# Patient Record
Sex: Female | Born: 1995 | Race: Black or African American | Hispanic: No | Marital: Single | Smoking: Former smoker
Health system: Southern US, Community
[De-identification: ages and names within clinical notes are randomized; demographics above are authoritative.]

---

## 2016-05-16 ENCOUNTER — Ambulatory Visit (INDEPENDENT_AMBULATORY_CARE_PROVIDER_SITE_OTHER): Payer: Medicaid Other | Admitting: Advanced Practice Midwife

## 2016-05-16 ENCOUNTER — Encounter: Payer: Self-pay | Admitting: Advanced Practice Midwife

## 2016-05-16 ENCOUNTER — Encounter: Payer: Self-pay | Admitting: *Deleted

## 2016-05-16 VITALS — BP 133/86 | HR 101 | Ht 67.5 in | Wt 149.0 lb

## 2016-05-16 DIAGNOSIS — A749 Chlamydial infection, unspecified: Secondary | ICD-10-CM | POA: Diagnosis not present

## 2016-05-16 DIAGNOSIS — Z3402 Encounter for supervision of normal first pregnancy, second trimester: Secondary | ICD-10-CM | POA: Diagnosis not present

## 2016-05-16 DIAGNOSIS — Z3689 Encounter for other specified antenatal screening: Secondary | ICD-10-CM

## 2016-05-16 DIAGNOSIS — O98812 Other maternal infectious and parasitic diseases complicating pregnancy, second trimester: Secondary | ICD-10-CM

## 2016-05-16 DIAGNOSIS — Z113 Encounter for screening for infections with a predominantly sexual mode of transmission: Secondary | ICD-10-CM | POA: Diagnosis not present

## 2016-05-16 DIAGNOSIS — Z3490 Encounter for supervision of normal pregnancy, unspecified, unspecified trimester: Secondary | ICD-10-CM | POA: Insufficient documentation

## 2016-05-16 DIAGNOSIS — O98312 Other infections with a predominantly sexual mode of transmission complicating pregnancy, second trimester: Secondary | ICD-10-CM

## 2016-05-16 DIAGNOSIS — Z3400 Encounter for supervision of normal first pregnancy, unspecified trimester: Secondary | ICD-10-CM

## 2016-05-16 DIAGNOSIS — Z23 Encounter for immunization: Secondary | ICD-10-CM | POA: Diagnosis not present

## 2016-05-16 DIAGNOSIS — O98819 Other maternal infectious and parasitic diseases complicating pregnancy, unspecified trimester: Secondary | ICD-10-CM

## 2016-05-16 DIAGNOSIS — Z363 Encounter for antenatal screening for malformations: Secondary | ICD-10-CM

## 2016-05-16 DIAGNOSIS — Z3A19 19 weeks gestation of pregnancy: Secondary | ICD-10-CM

## 2016-05-16 MED ORDER — CONCEPT OB 130-92.4-1 MG PO CAPS: 1.0000 | ORAL_CAPSULE | Freq: Every day | ORAL | 12 refills | Status: AC

## 2016-05-16 NOTE — Progress Notes (Signed)
Currently being treated for BV and took med last week for Chlamydia.  Seen @ [facility]  BEDSIDE u/s SHOWS IUP WITH  HC  MEASUREING [redacted]W[redacted]D FHTt IS 156 bpm

## 2016-05-16 NOTE — Patient Instructions (Signed)
Second Trimester of Pregnancy The second trimester is from week 13 through week 28, months 4 through 6. The second trimester is often a time when you feel your best. Your body has also adjusted to being pregnant, and you begin to feel better physically. Usually, morning sickness has lessened or quit completely, you may have more energy, and you may have an increase in appetite. The second trimester is also a time when the fetus is growing rapidly. At the end of the sixth month, the fetus is about 9 inches long and weighs about 1 pounds. You will likely begin to feel the baby move (quickening) between 18 and 20 weeks of the pregnancy. BODY CHANGES Your body goes through many changes during pregnancy. The changes vary from woman to woman.   Your weight will continue to increase. You will notice your lower abdomen bulging out.  You may begin to get stretch marks on your hips, abdomen, and breasts.  You may develop headaches that can be relieved by medicines approved by your health care provider.  You may urinate more often because the fetus is pressing on your bladder.  You may develop or continue to have heartburn as a result of your pregnancy.  You may develop constipation because certain hormones are causing the muscles that push waste through your intestines to slow down.  You may develop hemorrhoids or swollen, bulging veins (varicose veins).  You may have back pain because of the weight gain and pregnancy hormones relaxing your joints between the bones in your pelvis and as a result of a shift in weight and the muscles that support your balance.  Your breasts will continue to grow and be tender.  Your gums may bleed and may be sensitive to brushing and flossing.  Dark spots or blotches (chloasma, mask of pregnancy) may develop on your face. This will likely fade after the baby is born.  A dark line from your belly button to the pubic area (linea nigra) may appear. This will likely  fade after the baby is born.  You may have changes in your hair. These can include thickening of your hair, rapid growth, and changes in texture. Some women also have hair loss during or after pregnancy, or hair that feels dry or thin. Your hair will most likely return to normal after your baby is born. WHAT TO EXPECT AT YOUR PRENATAL VISITS During a routine prenatal visit:  You will be weighed to make sure you and the fetus are growing normally.  Your blood pressure will be taken.  Your abdomen will be measured to track your baby's growth.  The fetal heartbeat will be listened to.  Any test results from the previous visit will be discussed. Your health care provider may ask you:  How you are feeling.  If you are feeling the baby move.  If you have had any abnormal symptoms, such as leaking fluid, bleeding, severe headaches, or abdominal cramping.  If you are using any tobacco products, including cigarettes, chewing tobacco, and electronic cigarettes.  If you have any questions. Other tests that may be performed during your second trimester include:  Blood tests that check for:  Low iron levels (anemia).  Gestational diabetes (between 24 and 28 weeks).  Rh antibodies.  Urine tests to check for infections, diabetes, or protein in the urine.  An ultrasound to confirm the proper growth and development of the baby.  An amniocentesis to check for possible genetic problems.  Fetal screens for spina bifida   and Down syndrome.  HIV (human immunodeficiency virus) testing. Routine prenatal testing includes screening for HIV, unless you choose not to have this test. HOME CARE INSTRUCTIONS   Avoid all smoking, herbs, alcohol, and unprescribed drugs. These chemicals affect the formation and growth of the baby.  Do not use any tobacco products, including cigarettes, chewing tobacco, and electronic cigarettes. If you need help quitting, ask your health care provider. You may receive  counseling support and other resources to help you quit.  Follow your health care provider's instructions regarding medicine use. There are medicines that are either safe or unsafe to take during pregnancy.  Exercise only as directed by your health care provider. Experiencing uterine cramps is a good sign to stop exercising.  Continue to eat regular, healthy meals.  Wear a good support bra for breast tenderness.  Do not use hot tubs, steam rooms, or saunas.  Wear your seat belt at all times when driving.  Avoid raw meat, uncooked cheese, cat litter boxes, and soil used by cats. These carry germs that can cause birth defects in the baby.  Take your prenatal vitamins.  Take 1500-2000 mg of calcium daily starting at the 20th week of pregnancy until you deliver your baby.  Try taking a stool softener (if your health care provider approves) if you develop constipation. Eat more high-fiber foods, such as fresh vegetables or fruit and whole grains. Drink plenty of fluids to keep your urine clear or pale yellow.  Take warm sitz baths to soothe any pain or discomfort caused by hemorrhoids. Use hemorrhoid cream if your health care provider approves.  If you develop varicose veins, wear support hose. Elevate your feet for 15 minutes, 3-4 times a day. Limit salt in your diet.  Avoid heavy lifting, wear low heel shoes, and practice good posture.  Rest with your legs elevated if you have leg cramps or low back pain.  Visit your dentist if you have not gone yet during your pregnancy. Use a soft toothbrush to brush your teeth and be gentle when you floss.  A sexual relationship may be continued unless your health care provider directs you otherwise.  Continue to go to all your prenatal visits as directed by your health care provider. SEEK MEDICAL CARE IF:   You have dizziness.  You have mild pelvic cramps, pelvic pressure, or nagging pain in the abdominal area.  You have persistent nausea,  vomiting, or diarrhea.  You have a bad smelling vaginal discharge.  You have pain with urination. SEEK IMMEDIATE MEDICAL CARE IF:   You have a fever.  You are leaking fluid from your vagina.  You have spotting or bleeding from your vagina.  You have severe abdominal cramping or pain.  You have rapid weight gain or loss.  You have shortness of breath with chest pain.  You notice sudden or extreme swelling of your face, hands, ankles, feet, or legs.  You have not felt your baby move in over an hour.  You have severe headaches that do not go away with medicine.  You have vision changes.   This information is not intended to replace advice given to you by your health care provider. Make sure you discuss any questions you have with your health care provider.   Document Released: 06/14/2001 Document Revised: 07/11/2014 Document Reviewed: 08/21/2012 Elsevier Interactive Patient Education 2016 [employer]   Breastfeeding Deciding to breastfeed is one of the best choices you can make for you and your baby. A change   in hormones during pregnancy causes your breast tissue to grow and increases the number and size of your milk ducts. These hormones also allow proteins, sugars, and fats from your blood supply to make breast milk in your milk-producing glands. Hormones prevent breast milk from being released before your baby is born as well as prompt milk flow after birth. Once breastfeeding has begun, thoughts of your baby, as well as his or her sucking or crying, can stimulate the release of milk from your milk-producing glands.  BENEFITS OF BREASTFEEDING For Your Baby  Your first milk (colostrum) helps your baby's digestive system function better.  There are antibodies in your milk that help your baby fight off infections.  Your baby has a lower incidence of asthma, allergies, and sudden infant death syndrome.  The nutrients in breast milk are better for your baby than infant  formulas and are designed uniquely for your baby's needs.  Breast milk improves your baby's brain development.  Your baby is less likely to develop other conditions, such as childhood obesity, asthma, or type 2 diabetes mellitus. For You  Breastfeeding helps to create a very special bond between you and your baby.  Breastfeeding is convenient. Breast milk is always available at the correct temperature and costs nothing.  Breastfeeding helps to burn calories and helps you lose the weight gained during pregnancy.  Breastfeeding makes your uterus contract to its prepregnancy size faster and slows bleeding (lochia) after you give birth.   Breastfeeding helps to lower your risk of developing type 2 diabetes mellitus, osteoporosis, and breast or ovarian cancer later in life. SIGNS THAT YOUR BABY IS HUNGRY Early Signs of Hunger  Increased alertness or activity.  Stretching.  Movement of the head from side to side.  Movement of the head and opening of the mouth when the corner of the mouth or cheek is stroked (rooting).  Increased sucking sounds, smacking lips, cooing, sighing, or squeaking.  Hand-to-mouth movements.  Increased sucking of fingers or hands. Late Signs of Hunger  Fussing.  Intermittent crying. Extreme Signs of Hunger Signs of extreme hunger will require calming and consoling before your baby will be able to breastfeed successfully. Do not wait for the following signs of extreme hunger to occur before you initiate breastfeeding:  Restlessness.  A loud, strong cry.  Screaming. BREASTFEEDING BASICS Breastfeeding Initiation  Find a comfortable place to sit or lie down, with your neck and back well supported.  Place a pillow or rolled up blanket under your baby to bring him or her to the level of your breast (if you are seated). Nursing pillows are specially designed to help support your arms and your baby while you breastfeed.  Make sure that your baby's  abdomen is facing your abdomen.  Gently massage your breast. With your fingertips, massage from your chest wall toward your nipple in a circular motion. This encourages milk flow. You may need to continue this action during the feeding if your milk flows slowly.  Support your breast with 4 fingers underneath and your thumb above your nipple. Make sure your fingers are well away from your nipple and your baby's mouth.  Stroke your baby's lips gently with your finger or nipple.  When your baby's mouth is open wide enough, quickly bring your baby to your breast, placing your entire nipple and as much of the colored area around your nipple (areola) as possible into your baby's mouth.  More areola should be visible above your baby's upper lip than   below the lower lip.  Your baby's tongue should be between his or her lower gum and your breast.  Ensure that your baby's mouth is correctly positioned around your nipple (latched). Your baby's lips should create a seal on your breast and be turned out (everted).  It is common for your baby to suck about 2-3 minutes in order to start the flow of breast milk. Latching Teaching your baby how to latch on to your breast properly is very important. An improper latch can cause nipple pain and decreased milk supply for you and poor weight gain in your baby. Also, if your baby is not latched onto your nipple properly, he or she may swallow some air during feeding. This can make your baby fussy. Burping your baby when you switch breasts during the feeding can help to get rid of the air. However, teaching your baby to latch on properly is still the best way to prevent fussiness from swallowing air while breastfeeding. Signs that your baby has successfully latched on to your nipple:  Silent tugging or silent sucking, without causing you pain.  Swallowing heard between every 3-4 sucks.  Muscle movement above and in front of his or her ears while sucking. Signs  that your baby has not successfully latched on to nipple:  Sucking sounds or smacking sounds from your baby while breastfeeding.  Nipple pain. If you think your baby has not latched on correctly, slip your finger into the corner of your baby's mouth to break the suction and place it between your baby's gums. Attempt breastfeeding initiation again. Signs of Successful Breastfeeding Signs from your baby:  A gradual decrease in the number of sucks or complete cessation of sucking.  Falling asleep.  Relaxation of his or her body.  Retention of a small amount of milk in his or her mouth.  Letting go of your breast by himself or herself. Signs from you:  Breasts that have increased in firmness, weight, and size 1-3 hours after feeding.  Breasts that are softer immediately after breastfeeding.  Increased milk volume, as well as a change in milk consistency and color by the fifth day of breastfeeding.  Nipples that are not sore, cracked, or bleeding. Signs That Your Baby is Getting Enough Milk  Wetting at least 3 diapers in a 24-hour period. The urine should be clear and pale yellow by age 5 days.  At least 3 stools in a 24-hour period by age 5 days. The stool should be soft and yellow.  At least 3 stools in a 24-hour period by age 7 days. The stool should be seedy and yellow.  No loss of weight greater than 10% of birth weight during the first 3 days of age.  Average weight gain of 4-7 ounces (113-198 g) per week after age 4 days.  Consistent daily weight gain by age 5 days, without weight loss after the age of 2 weeks. After a feeding, your baby may spit up a small amount. This is common. BREASTFEEDING FREQUENCY AND DURATION Frequent feeding will help you make more milk and can prevent sore nipples and breast engorgement. Breastfeed when you feel the need to reduce the fullness of your breasts or when your baby shows signs of hunger. This is called "breastfeeding on demand." Avoid  introducing a pacifier to your baby while you are working to establish breastfeeding (the first 4-6 weeks after your baby is born). After this time you may choose to use a pacifier. Research has shown that   pacifier use during the first year of a baby's life decreases the risk of sudden infant death syndrome (SIDS). Allow your baby to feed on each breast as long as he or she wants. Breastfeed until your baby is finished feeding. When your baby unlatches or falls asleep while feeding from the first breast, offer the second breast. Because newborns are often sleepy in the first few weeks of life, you may need to awaken your baby to get him or her to feed. Breastfeeding times will vary from baby to baby. However, the following rules can serve as a guide to help you ensure that your baby is properly fed:  Newborns (babies 4 weeks of age or younger) may breastfeed every 1-3 hours.  Newborns should not go longer than 3 hours during the day or 5 hours during the night without breastfeeding.  You should breastfeed your baby a minimum of 8 times in a 24-hour period until you begin to introduce solid foods to your baby at around 6 months of age. BREAST MILK PUMPING Pumping and storing breast milk allows you to ensure that your baby is exclusively fed your breast milk, even at times when you are unable to breastfeed. This is especially important if you are going back to work while you are still breastfeeding or when you are not able to be present during feedings. Your lactation consultant can give you guidelines on how long it is safe to store breast milk. A breast pump is a machine that allows you to pump milk from your breast into a sterile bottle. The pumped breast milk can then be stored in a refrigerator or freezer. Some breast pumps are operated by hand, while others use electricity. Ask your lactation consultant which type will work best for you. Breast pumps can be purchased, but some hospitals and  breastfeeding support groups lease breast pumps on a monthly basis. A lactation consultant can teach you how to hand express breast milk, if you prefer not to use a pump. CARING FOR YOUR BREASTS WHILE YOU BREASTFEED Nipples can become dry, cracked, and sore while breastfeeding. The following recommendations can help keep your breasts moisturized and healthy:  Avoid using soap on your nipples.  Wear a supportive bra. Although not required, special nursing bras and tank tops are designed to allow access to your breasts for breastfeeding without taking off your entire bra or top. Avoid wearing underwire-style bras or extremely tight bras.  Air dry your nipples for 3-4minutes after each feeding.  Use only cotton bra pads to absorb leaked breast milk. Leaking of breast milk between feedings is normal.  Use lanolin on your nipples after breastfeeding. Lanolin helps to maintain your skin's normal moisture barrier. If you use pure lanolin, you do not need to wash it off before feeding your baby again. Pure lanolin is not toxic to your baby. You may also hand express a few drops of breast milk and gently massage that milk into your nipples and allow the milk to air dry. In the first few weeks after giving birth, some women experience extremely full breasts (engorgement). Engorgement can make your breasts feel heavy, warm, and tender to the touch. Engorgement peaks within 3-5 days after you give birth. The following recommendations can help ease engorgement:  Completely empty your breasts while breastfeeding or pumping. You may want to start by applying warm, moist heat (in the shower or with warm water-soaked hand towels) just before feeding or pumping. This increases circulation and helps the milk   flow. If your baby does not completely empty your breasts while breastfeeding, pump any extra milk after he or she is finished.  Wear a snug bra (nursing or regular) or tank top for 1-2 days to signal your body  to slightly decrease milk production.  Apply ice packs to your breasts, unless this is too uncomfortable for you.  Make sure that your baby is latched on and positioned properly while breastfeeding. If engorgement persists after 48 hours of following these recommendations, contact your health care provider or a lactation consultant. OVERALL HEALTH CARE RECOMMENDATIONS WHILE BREASTFEEDING  Eat healthy foods. Alternate between meals and snacks, eating 3 of each per day. Because what you eat affects your breast milk, some of the foods may make your baby more irritable than usual. Avoid eating these foods if you are sure that they are negatively affecting your baby.  Drink milk, fruit juice, and water to satisfy your thirst (about 10 glasses a day).  Rest often, relax, and continue to take your prenatal vitamins to prevent fatigue, stress, and anemia.  Continue breast self-awareness checks.  Avoid chewing and smoking tobacco. Chemicals from cigarettes that pass into breast milk and exposure to secondhand smoke may harm your baby.  Avoid alcohol and drug use, including marijuana. Some medicines that may be harmful to your baby can pass through breast milk. It is important to ask your health care provider before taking any medicine, including all over-the-counter and prescription medicine as well as vitamin and herbal supplements. It is possible to become pregnant while breastfeeding. If birth control is desired, ask your health care provider about options that will be safe for your baby. SEEK MEDICAL CARE IF:  You feel like you want to stop breastfeeding or have become frustrated with breastfeeding.  You have painful breasts or nipples.  Your nipples are cracked or bleeding.  Your breasts are red, tender, or warm.  You have a swollen area on either breast.  You have a fever or chills.  You have nausea or vomiting.  You have drainage other than breast milk from your nipples.  Your  breasts do not become full before feedings by the fifth day after you give birth.  You feel sad and depressed.  Your baby is too sleepy to eat well.  Your baby is having trouble sleeping.   Your baby is wetting less than 3 diapers in a 24-hour period.  Your baby has less than 3 stools in a 24-hour period.  Your baby's skin or the white part of his or her eyes becomes yellow.   Your baby is not gaining weight by 5 days of age. SEEK IMMEDIATE MEDICAL CARE IF:  Your baby is overly tired (lethargic) and does not want to wake up and feed.  Your baby develops an unexplained fever.   This information is not intended to replace advice given to you by your health care provider. Make sure you discuss any questions you have with your health care provider.   Document Released: 06/20/2005 Document Revised: 03/11/2015 Document Reviewed: 12/12/2012 Elsevier Interactive Patient Education 2016 [employer] Inc.  

## 2016-05-16 NOTE — Progress Notes (Signed)
   Subjective:    Patricia Clarke is a G1P0 7863w4d by LMP/US today being seen today for her first obstetrical visit.  Her obstetrical history is significant for nulliparity. Patient does intend to breast feed. Pregnancy history fully reviewed.  [facility] Dx Chlamydia, BV ~4 days ago. Tx'd w/ Azithro, Rocephin, on 4th day of Flagyl. partner not yet Tx'd, but planning to. No IC since Tx.   Patient reports no complaints.  Vitals:   05/16/16 1105 05/16/16 1108  BP: 133/86   Pulse: (!) 101   Weight: 149 lb (67.6 kg)   Height:  5' 7.5" (1.715 m)    HISTORY: OB History  Gravida Para Term Preterm AB Living  1            SAB TAB Ectopic Multiple Live Births               # Outcome Date GA Lbr Len/2nd Weight Sex Delivery Anes PTL Lv  1 Current              History reviewed. No pertinent past medical history. History reviewed. No pertinent surgical history. Family History  Problem Relation Age of Onset  . Hypertension Maternal Grandmother   . Kidney disease Maternal Grandmother      Exam    Uterus:   16 week size  Pelvic Exam: Deferred due to recent exam.   Bony Pelvis: unproven  System: Breast:  normal appearance, no masses or tenderness   Skin: normal coloration and turgor, no rashes    Neurologic: oriented, grossly non-focal   Extremities: normal strength, tone, and muscle mass   HEENT sclera clear, anicteric, oropharynx clear, no lesions and thyroid without masses   Mouth/Teeth mucous membranes moist, pharynx normal without lesions and dental hygiene good   Neck supple and no masses   Cardiovascular: regular rate and rhythm, no murmurs or gallops   Respiratory:  appears well, vitals normal, no respiratory distress, acyanotic, normal RR, neck free of mass or lymphadenopathy   Abdomen: soft, non-tender; bowel sounds normal; no masses,  no organomegaly   Urinary: not examined      Assessment:    Pregnancy: G1P0 Patient Active Problem List   Diagnosis Date  Noted  . Supervision of normal pregnancy, antepartum 05/16/2016     1. Supervision of normal first pregnancy, antepartum  - Prenatal Profile - HgB A1c - Culture, OB Urine - GC/Chlamydia probe amp ([place])not at [facility] - Pain Mgmt, Profile 6 Conf w/o mM, U - Glucose - US bedside; Future - Cystic fibrosis diagnostic study - Sickle Cell Scr - AFP, Quad Screen - US MFM OB DETAIL +14 WK; Future  2. Encounter for antenatal screening for malformation using ultrasound  - US MFM OB DETAIL +14 WK; Future  3. [redacted] weeks gestation of pregnancy  - US MFM OB DETAIL +14 WK; Future  Plan:     Initial labs drawn. Prenatal vitamins. Problem list reviewed and updated. Genetic Screening discussed Quad Screen: ordered.  Ultrasound discussed; fetal survey: ordered.  Follow up in 4 weeks. Discussed clinic routines, schedule of care and testing, genetic screening options, involvement of students and residents under the direct supervision of APPs and doctors and presence of female providers. Pt verbalized understanding.   Patricia Clarke 05/16/2016

## 2016-05-17 LAB — PRENATAL PROFILE (SOLSTAS)
Antibody Screen: NEGATIVE
BASOS ABS: 0 {cells}/uL (ref 0–200)
BASOS PCT: 0 %
EOS ABS: 105 {cells}/uL (ref 15–500)
Eosinophils Relative: 1 %
HCT: 37.7 % (ref 35.0–45.0)
HEP B S AG: NEGATIVE
HIV: NONREACTIVE
Hemoglobin: 12.9 g/dL (ref 11.7–15.5)
Lymphocytes Relative: 18 %
Lymphs Abs: 1890 cells/uL (ref 850–3900)
MCH: 28 pg (ref 27.0–33.0)
MCHC: 34.2 g/dL (ref 32.0–36.0)
MCV: 81.8 fL (ref 80.0–100.0)
MONO ABS: 735 {cells}/uL (ref 200–950)
MPV: 9.7 fL (ref 7.5–12.5)
Monocytes Relative: 7 %
NEUTROS ABS: 7770 {cells}/uL (ref 1500–7800)
Neutrophils Relative %: 74 %
PLATELETS: 307 10*3/uL (ref 140–400)
RBC: 4.61 MIL/uL (ref 3.80–5.10)
RDW: 15.2 % — AB (ref 11.0–15.0)
RUBELLA: 1.23 {index} — AB (ref <0.90)
Rh Type: POSITIVE
WBC: 10.5 10*3/uL (ref 3.8–10.8)

## 2016-05-17 LAB — AFP, QUAD SCREEN
AFP: 48.2 ng/mL
Age Alone: 1:1180 {titer}
CURR GEST AGE: 15.6 wk
Down Syndrome Scr Risk Est: 1:29600 {titer}
HCG, Total: 69.5 IU/mL
INH: 120.7 pg/mL
Interpretation-AFP: NEGATIVE
MOM FOR INH: 0.67
MoM for AFP: 1.58
MoM for hCG: 1.59
Open Spina bifida: NEGATIVE
Tri 18 Scr Risk Est: NEGATIVE
UE3 MOM: 0.96
uE3 Value: 0.65 ng/mL

## 2016-05-17 LAB — HEMOGLOBIN A1C
HEMOGLOBIN A1C: 4.9 % (ref <5.7)
Mean Plasma Glucose: 94 mg/dL

## 2016-05-17 LAB — CULTURE, OB URINE: Organism ID, Bacteria: NO GROWTH

## 2016-05-17 LAB — SICKLE CELL SCREEN: Sickle Cell Screen: NEGATIVE

## 2016-05-17 LAB — GLUCOSE, RANDOM: GLUCOSE: 70 mg/dL (ref 65–99)

## 2016-05-20 LAB — PAIN MGMT, PROFILE 6 CONF W/O MM, U
6 ACETYLMORPHINE: NEGATIVE ng/mL (ref <10)
ALCOHOL METABOLITES: NEGATIVE ng/mL (ref <500)
Amphetamines: NEGATIVE ng/mL (ref <500)
BENZODIAZEPINES: NEGATIVE ng/mL (ref <100)
Barbiturates: NEGATIVE ng/mL (ref <300)
Cocaine Metabolite: NEGATIVE ng/mL (ref <150)
Creatinine: 281.9 mg/dL (ref >=20.0)
ETHYL GLUCURONIDE (ETG): NEGATIVE ng/mL (ref <500)
ETHYL SULFATE (ETS): NEGATIVE ng/mL (ref <100)
MARIJUANA METABOLITE: NEGATIVE ng/mL (ref <20)
METHADONE METABOLITE: NEGATIVE ng/mL (ref <100)
OPIATES: NEGATIVE ng/mL (ref <100)
Oxidant: NEGATIVE ug/mL (ref <200)
Oxycodone: NEGATIVE ng/mL (ref <100)
Phencyclidine: NEGATIVE ng/mL (ref <25)
Please note:: 0
pH: 6.25 (ref 4.5–9.0)

## 2016-05-23 LAB — CYSTIC FIBROSIS DIAGNOSTIC STUDY

## 2016-05-31 ENCOUNTER — Encounter (HOSPITAL_COMMUNITY): Payer: Self-pay | Admitting: Advanced Practice Midwife

## 2016-06-08 ENCOUNTER — Ambulatory Visit (HOSPITAL_COMMUNITY): Payer: Self-pay

## 2016-06-08 ENCOUNTER — Ambulatory Visit (HOSPITAL_COMMUNITY)
Admission: RE | Admit: 2016-06-08 | Discharge: 2016-06-08 | Disposition: A | Discharge status: Home / Self Care | Payer: Medicaid Other | Source: Ambulatory Visit | Attending: Advanced Practice Midwife | Admitting: Advanced Practice Midwife

## 2016-06-08 DIAGNOSIS — Z3A19 19 weeks gestation of pregnancy: Secondary | ICD-10-CM

## 2016-06-08 DIAGNOSIS — Z3A18 18 weeks gestation of pregnancy: Secondary | ICD-10-CM | POA: Insufficient documentation

## 2016-06-08 DIAGNOSIS — Z3400 Encounter for supervision of normal first pregnancy, unspecified trimester: Secondary | ICD-10-CM

## 2016-06-08 DIAGNOSIS — Z363 Encounter for antenatal screening for malformations: Secondary | ICD-10-CM | POA: Insufficient documentation

## 2016-06-13 ENCOUNTER — Encounter: Payer: Self-pay | Admitting: Certified Nurse Midwife

## 2016-06-17 ENCOUNTER — Ambulatory Visit (INDEPENDENT_AMBULATORY_CARE_PROVIDER_SITE_OTHER): Payer: Medicaid Other | Admitting: Advanced Practice Midwife

## 2016-06-17 VITALS — BP 124/85 | HR 95 | Wt 154.0 lb

## 2016-06-17 DIAGNOSIS — IMO0002 Reserved for concepts with insufficient information to code with codable children: Secondary | ICD-10-CM

## 2016-06-17 DIAGNOSIS — Z0489 Encounter for examination and observation for other specified reasons: Secondary | ICD-10-CM

## 2016-06-17 DIAGNOSIS — O98812 Other maternal infectious and parasitic diseases complicating pregnancy, second trimester: Secondary | ICD-10-CM

## 2016-06-17 DIAGNOSIS — Z3A20 20 weeks gestation of pregnancy: Secondary | ICD-10-CM

## 2016-06-17 DIAGNOSIS — B3731 Acute candidiasis of vulva and vagina: Secondary | ICD-10-CM

## 2016-06-17 DIAGNOSIS — Z3400 Encounter for supervision of normal first pregnancy, unspecified trimester: Secondary | ICD-10-CM

## 2016-06-17 DIAGNOSIS — B373 Candidiasis of vulva and vagina: Secondary | ICD-10-CM

## 2016-06-17 DIAGNOSIS — L253 Unspecified contact dermatitis due to other chemical products: Secondary | ICD-10-CM

## 2016-06-17 MED ORDER — TERCONAZOLE 0.4 % VA CREA: 1.0000 | TOPICAL_CREAM | Freq: Every day | VAGINAL | 0 refills | Status: AC

## 2016-06-17 MED ORDER — NYSTATIN-TRIAMCINOLONE 100000-0.1 UNIT/GM-% EX OINT: 1.0000 "application " | TOPICAL_OINTMENT | Freq: Two times a day (BID) | CUTANEOUS | 0 refills | Status: AC

## 2016-06-17 NOTE — Patient Instructions (Signed)
Second Trimester of Pregnancy The second trimester is from week 13 through week 28 (months 4 through 6). The second trimester is often a time when you feel your best. Your body has also adjusted to being pregnant, and you begin to feel better physically. Usually, morning sickness has lessened or quit completely, you may have more energy, and you may have an increase in appetite. The second trimester is also a time when the fetus is growing rapidly. At the end of the sixth month, the fetus is about 9 inches long and weighs about 1 pounds. You will likely begin to feel the baby move (quickening) between 18 and 20 weeks of the pregnancy. Body changes during your second trimester Your body continues to go through many changes during your second trimester. The changes vary from woman to woman.  Your weight will continue to increase. You will notice your lower abdomen bulging out.  You may begin to get stretch marks on your hips, abdomen, and breasts.  You may develop headaches that can be relieved by medicines. The medicines should be approved by your health care provider.  You may urinate more often because the fetus is pressing on your bladder.  You may develop or continue to have heartburn as a result of your pregnancy.  You may develop constipation because certain hormones are causing the muscles that push waste through your intestines to slow down.  You may develop hemorrhoids or swollen, bulging veins (varicose veins).  You may have back pain. This is caused by:  Weight gain.  Pregnancy hormones that are relaxing the joints in your pelvis.  A shift in weight and the muscles that support your balance.  Your breasts will continue to grow and they will continue to become tender.  Your gums may bleed and may be sensitive to brushing and flossing.  Dark spots or blotches (chloasma, mask of pregnancy) may develop on your face. This will likely fade after the baby is born.  A dark line  from your belly button to the pubic area (linea nigra) may appear. This will likely fade after the baby is born.  You may have changes in your hair. These can include thickening of your hair, rapid growth, and changes in texture. Some women also have hair loss during or after pregnancy, or hair that feels dry or thin. Your hair will most likely return to normal after your baby is born. What to expect at prenatal visits During a routine prenatal visit:  You will be weighed to make sure you and the fetus are growing normally.  Your blood pressure will be taken.  Your abdomen will be measured to track your baby's growth.  The fetal heartbeat will be listened to.  Any test results from the previous visit will be discussed. Your health care provider may ask you:  How you are feeling.  If you are feeling the baby move.  If you have had any abnormal symptoms, such as leaking fluid, bleeding, severe headaches, or abdominal cramping.  If you are using any tobacco products, including cigarettes, chewing tobacco, and electronic cigarettes.  If you have any questions. Other tests that may be performed during your second trimester include:  Blood tests that check for:  Low iron levels (anemia).  Gestational diabetes (between 24 and 28 weeks).  Rh antibodies. This is to check for a protein on red blood cells (Rh factor).  Urine tests to check for infections, diabetes, or protein in the urine.  An ultrasound to   confirm the proper growth and development of the baby.  An amniocentesis to check for possible genetic problems.  Fetal screens for spina bifida and Down syndrome.  HIV (human immunodeficiency virus) testing. Routine prenatal testing includes screening for HIV, unless you choose not to have this test. Follow these instructions at home: Eating and drinking  Continue to eat regular, healthy meals.  Avoid raw meat, uncooked cheese, cat litter boxes, and soil used by cats. These  carry germs that can cause birth defects in the baby.  Take your prenatal vitamins.  Take 1500-2000 mg of calcium daily starting at the 20th week of pregnancy until you deliver your baby.  If you develop constipation:  Take over-the-counter or prescription medicines.  Drink enough fluid to keep your urine clear or pale yellow.  Eat foods that are high in fiber, such as fresh fruits and vegetables, whole grains, and beans.  Limit foods that are high in fat and processed sugars, such as fried and sweet foods. Activity  Exercise only as directed by your health care provider. Experiencing uterine cramps is a good sign to stop exercising.  Avoid heavy lifting, wear low heel shoes, and practice good posture.  Wear your seat belt at all times when driving.  Rest with your legs elevated if you have leg cramps or low back pain.  Wear a good support bra for breast tenderness.  Do not use hot tubs, steam rooms, or saunas. Lifestyle  Avoid all smoking, herbs, alcohol, and unprescribed drugs. These chemicals affect the formation and growth of the baby.  Do not use any products that contain nicotine or tobacco, such as cigarettes and e-cigarettes. If you need help quitting, ask your health care provider.  A sexual relationship may be continued unless your health care provider directs you otherwise. General instructions  Follow your health care provider's instructions regarding medicine use. There are medicines that are either safe or unsafe to take during pregnancy.  Take warm sitz baths to soothe any pain or discomfort caused by hemorrhoids. Use hemorrhoid cream if your health care provider approves.  If you develop varicose veins, wear support hose. Elevate your feet for 15 minutes, 3-4 times a day. Limit salt in your diet.  Visit your dentist if you have not gone yet during your pregnancy. Use a soft toothbrush to brush your teeth and be gentle when you floss.  Keep all follow-up  prenatal visits as told by your health care provider. This is important. Contact a health care provider if:  You have dizziness.  You have mild pelvic cramps, pelvic pressure, or nagging pain in the abdominal area.  You have persistent nausea, vomiting, or diarrhea.  You have a bad smelling vaginal discharge.  You have pain with urination. Get help right away if:  You have a fever.  You are leaking fluid from your vagina.  You have spotting or bleeding from your vagina.  You have severe abdominal cramping or pain.  You have rapid weight gain or weight loss.  You have shortness of breath with chest pain.  You notice sudden or extreme swelling of your face, hands, ankles, feet, or legs.  You have not felt your baby move in over an hour.  You have severe headaches that do not go away with medicine.  You have vision changes. Summary  The second trimester is from week 13 through week 28 (months 4 through 6). It is also a time when the fetus is growing rapidly.  Your body goes   through many changes during pregnancy. The changes vary from woman to woman.  Avoid all smoking, herbs, alcohol, and unprescribed drugs. These chemicals affect the formation and growth your baby.  Do not use any tobacco products, such as cigarettes, chewing tobacco, and e-cigarettes. If you need help quitting, ask your health care provider.  Contact your health care provider if you have any questions. Keep all prenatal visits as told by your health care provider. This is important. This information is not intended to replace advice given to you by your health care provider. Make sure you discuss any questions you have with your health care provider. Document Released: 06/14/2001 Document Revised: 11/26/2015 Document Reviewed: 08/21/2012 [employer] Interactive Patient Education  2017 [employer]  

## 2016-06-17 NOTE — Progress Notes (Signed)
   PRENATAL VISIT NOTE  Subjective:  Patricia Clarke is a 20 y.o. G1P0 at 7574w1d being seen today for ongoing prenatal care.  She is currently monitored for the following issues for this low-risk pregnancy and has Supervision of normal pregnancy, antepartum and Chlamydia infection affecting pregnancy on her problem list.  Patient reports reaction to latex condoms with itching, lasting 2 weeks.  Contractions: Not present. Vag. Bleeding: None.  Movement: Present. Denies leaking of fluid.   The following portions of the patient's history were reviewed and updated as appropriate: allergies, current medications, past family history, past medical history, past social history, past surgical history and problem list. Problem list updated.  Objective:   Vitals:   06/17/16 1045  BP: 124/85  Pulse: 95  Weight: 154 lb (69.9 kg)    Fetal Status: Fetal Heart Rate (bpm): 153   Movement: Present     General:  Alert, oriented and cooperative. Patient is in no acute distress.  Skin: Skin is warm and dry. No rash noted.   Cardiovascular: Normal heart rate noted  Respiratory: Normal respiratory effort, no problems with respiration noted  Abdomen: Soft, gravid, appropriate for gestational age. Pain/Pressure: Absent     Pelvic:  Cervical exam deferred      Visual inspection reveals mild erythema of bilateral labia with moderate amount thick white discharge c/w yeast  Extremities: Normal range of motion.  Edema: None  Mental Status: Normal mood and affect. Normal behavior. Normal judgment and thought content.   Assessment and Plan:  Pregnancy: G1P0 at 7274w1d  1. [redacted] weeks gestation of pregnancy  - Culture, OB Urine - US MFM OB FOLLOW UP; Future  2. Evaluate anatomy not seen on prior sonogram  - US MFM OB FOLLOW UP; Future  3. Vaginal candidiasis --Likely contact dermatitis from condoms caused irritation, leading to candidiasis.  - terconazole (TERAZOL 7) 0.4 % vaginal cream; Place 1  applicator vaginally at bedtime.  Dispense: 45 g; Refill: 0 - Wet prep, genital  4. Allergic contact dermatitis due to latex  - nystatin-triamcinolone ointment (MYCOLOG); Apply 1 application topically 2 (two) times daily.  Dispense: 30 g; Refill: 0   Preterm labor symptoms and general obstetric precautions including but not limited to vaginal bleeding, contractions, leaking of fluid and fetal movement were reviewed in detail with the patient. Please refer to After Visit Summary for other counseling recommendations.  Return in about 4 weeks (around 07/15/2016).   Hurshel PartyLisa A Leftwich-Kirby, CNM

## 2016-06-18 LAB — WET PREP, GENITAL
Trich, Wet Prep: NONE SEEN
Yeast Wet Prep HPF POC: NONE SEEN

## 2016-06-18 LAB — CULTURE, OB URINE

## 2016-07-15 ENCOUNTER — Encounter: Payer: Medicaid Other | Admitting: Advanced Practice Midwife

## 2016-08-12 ENCOUNTER — Other Ambulatory Visit (HOSPITAL_COMMUNITY)
Admission: RE | Admit: 2016-08-12 | Discharge: 2016-08-12 | Disposition: A | Discharge status: Home / Self Care | Payer: Medicaid Other | Source: Ambulatory Visit | Attending: Advanced Practice Midwife | Admitting: Advanced Practice Midwife

## 2016-08-12 ENCOUNTER — Ambulatory Visit (INDEPENDENT_AMBULATORY_CARE_PROVIDER_SITE_OTHER): Payer: Medicaid Other | Admitting: Advanced Practice Midwife

## 2016-08-12 VITALS — BP 121/82 | HR 87 | Wt 174.0 lb

## 2016-08-12 DIAGNOSIS — Z113 Encounter for screening for infections with a predominantly sexual mode of transmission: Secondary | ICD-10-CM | POA: Diagnosis present

## 2016-08-12 DIAGNOSIS — O0933 Supervision of pregnancy with insufficient antenatal care, third trimester: Secondary | ICD-10-CM | POA: Insufficient documentation

## 2016-08-12 DIAGNOSIS — A749 Chlamydial infection, unspecified: Secondary | ICD-10-CM

## 2016-08-12 DIAGNOSIS — Z0489 Encounter for examination and observation for other specified reasons: Secondary | ICD-10-CM

## 2016-08-12 DIAGNOSIS — Z3492 Encounter for supervision of normal pregnancy, unspecified, second trimester: Secondary | ICD-10-CM

## 2016-08-12 DIAGNOSIS — Z3400 Encounter for supervision of normal first pregnancy, unspecified trimester: Secondary | ICD-10-CM

## 2016-08-12 DIAGNOSIS — O98819 Other maternal infectious and parasitic diseases complicating pregnancy, unspecified trimester: Secondary | ICD-10-CM

## 2016-08-12 DIAGNOSIS — IMO0002 Reserved for concepts with insufficient information to code with codable children: Secondary | ICD-10-CM

## 2016-08-12 DIAGNOSIS — Z3A28 28 weeks gestation of pregnancy: Secondary | ICD-10-CM

## 2016-08-12 DIAGNOSIS — O98313 Other infections with a predominantly sexual mode of transmission complicating pregnancy, third trimester: Secondary | ICD-10-CM

## 2016-08-12 LAB — CBC
HEMATOCRIT: 37.4 % (ref 35.0–45.0)
Hemoglobin: 12.8 g/dL (ref 11.7–15.5)
MCH: 27.9 pg (ref 27.0–33.0)
MCHC: 34.2 g/dL (ref 32.0–36.0)
MCV: 81.5 fL (ref 80.0–100.0)
MPV: 10.4 fL (ref 7.5–12.5)
Platelets: 289 10*3/uL (ref 140–400)
RBC: 4.59 MIL/uL (ref 3.80–5.10)
RDW: 14.3 % (ref 11.0–15.0)
WBC: 10.6 10*3/uL (ref 3.8–10.8)

## 2016-08-12 NOTE — Patient Instructions (Addendum)
Third Trimester of Pregnancy The third trimester is from week 29 through week 40 (months 7 through 9). The third trimester is a time when the unborn baby (fetus) is growing rapidly. At the end of the ninth month, the fetus is about 20 inches in length and weighs 6-10 pounds. Body changes during your third trimester Your body goes through many changes during pregnancy. The changes vary from woman to woman. During the third trimester:  Your weight will continue to increase. You can expect to gain 25-35 pounds (11-16 kg) by the end of the pregnancy.  You may begin to get stretch marks on your hips, abdomen, and breasts.  You may urinate more often because the fetus is moving lower into your pelvis and pressing on your bladder.  You may develop or continue to have heartburn. This is caused by increased hormones that slow down muscles in the digestive tract.  You may develop or continue to have constipation because increased hormones slow digestion and cause the muscles that push waste through your intestines to relax.  You may develop hemorrhoids. These are swollen veins (varicose veins) in the rectum that can itch or be painful.  You may develop swollen, bulging veins (varicose veins) in your legs.  You may have increased body aches in the pelvis, back, or thighs. This is due to weight gain and increased hormones that are relaxing your joints.  You may have changes in your hair. These can include thickening of your hair, rapid growth, and changes in texture. Some women also have hair loss during or after pregnancy, or hair that feels dry or thin. Your hair will most likely return to normal after your baby is born.  Your breasts will continue to grow and they will continue to become tender. A yellow fluid (colostrum) may leak from your breasts. This is the first milk you are producing for your baby.  Your belly button may stick out.  You may notice more swelling in your hands, face, or  ankles.  You may have increased tingling or numbness in your hands, arms, and legs. The skin on your belly may also feel numb.  You may feel short of breath because of your expanding uterus.  You may have more problems sleeping. This can be caused by the size of your belly, increased need to urinate, and an increase in your body's metabolism.  You may notice the fetus "dropping," or moving lower in your abdomen.  You may have increased vaginal discharge.  Your cervix becomes thin and soft (effaced) near your due date. What to expect at prenatal visits You will have prenatal exams every 2 weeks until week 36. Then you will have weekly prenatal exams. During a routine prenatal visit:  You will be weighed to make sure you and the fetus are growing normally.  Your blood pressure will be taken.  Your abdomen will be measured to track your baby's growth.  The fetal heartbeat will be listened to.  Any test results from the previous visit will be discussed.  You may have a cervical check near your due date to see if you have effaced. At around 36 weeks, your health care provider will check your cervix. At the same time, your health care provider will also perform a test on the secretions of the vaginal tissue. This test is to determine if a type of bacteria, Group B streptococcus, is present. Your health care provider will explain this further. Your health care provider may ask you:    What your birth plan is.  How you are feeling.  If you are feeling the baby move.  If you have had any abnormal symptoms, such as leaking fluid, bleeding, severe headaches, or abdominal cramping.  If you are using any tobacco products, including cigarettes, chewing tobacco, and electronic cigarettes.  If you have any questions. Other tests or screenings that may be performed during your third trimester include:  Blood tests that check for low iron levels (anemia).  Fetal testing to check the health,  activity level, and growth of the fetus. Testing is done if you have certain medical conditions or if there are problems during the pregnancy.  Nonstress test (NST). This test checks the health of your baby to make sure there are no signs of problems, such as the baby not getting enough oxygen. During this test, a belt is placed around your belly. The baby is made to move, and its heart rate is monitored during movement. What is false labor? False labor is a condition in which you feel small, irregular tightenings of the muscles in the womb (contractions) that eventually go away. These are called Braxton Hicks contractions. Contractions may last for hours, days, or even weeks before true labor sets in. If contractions come at regular intervals, become more frequent, increase in intensity, or become painful, you should see your health care provider. What are the signs of labor?  Abdominal cramps.  Regular contractions that start at 10 minutes apart and become stronger and more frequent with time.  Contractions that start on the top of the uterus and spread down to the lower abdomen and back.  Increased pelvic pressure and dull back pain.  A watery or bloody mucus discharge that comes from the vagina.  Leaking of amniotic fluid. This is also known as your "water breaking." It could be a slow trickle or a gush. Let your doctor know if it has a color or strange odor. If you have any of these signs, call your health care provider right away, even if it is before your due date. Follow these instructions at home: Eating and drinking  Continue to eat regular, healthy meals.  Do not eat:  Raw meat or meat spreads.  Unpasteurized milk or cheese.  Unpasteurized juice.  Store-made salad.  Refrigerated smoked seafood.  Hot dogs or deli meat, unless they are piping hot.  More than 6 ounces of albacore tuna a week.  Shark, swordfish, king mackerel, or tile fish.  Store-made salads.  Raw  sprouts, such as mung bean or alfalfa sprouts.  Take prenatal vitamins as told by your health care provider.  Take 1000 mg of calcium daily as told by your health care provider.  If you develop constipation:  Take over-the-counter or prescription medicines.  Drink enough fluid to keep your urine clear or pale yellow.  Eat foods that are high in fiber, such as fresh fruits and vegetables, whole grains, and beans.  Limit foods that are high in fat and processed sugars, such as fried and sweet foods. Activity  Exercise only as directed by your health care provider. Healthy pregnant women should aim for 2 hours and 30 minutes of moderate exercise per week. If you experience any pain or discomfort while exercising, stop.  Avoid heavy lifting.  Do not exercise in extreme heat or humidity, or at high altitudes.  Wear low-heel, comfortable shoes.  Practice good posture.  Do not travel far distances unless it is absolutely necessary and only with the approval   of your health care provider.  Wear your seat belt at all times while in a car, on a bus, or on a plane.  Take frequent breaks and rest with your legs elevated if you have leg cramps or low back pain.  Do not use hot tubs, steam rooms, or saunas.  You may continue to have sex unless your health care provider tells you otherwise. Lifestyle  Do not use any products that contain nicotine or tobacco, such as cigarettes and e-cigarettes. If you need help quitting, ask your health care provider.  Do not drink alcohol.  Do not use any medicinal herbs or unprescribed drugs. These chemicals affect the formation and growth of the baby.  If you develop varicose veins:  Wear support pantyhose or compression stockings as told by your healthcare provider.  Elevate your feet for 15 minutes, 3-4 times a day.  Wear a supportive maternity bra to help with breast tenderness. General instructions  Take over-the-counter and prescription  medicines only as told by your health care provider. There are medicines that are either safe or unsafe to take during pregnancy.  Take warm sitz baths to soothe any pain or discomfort caused by hemorrhoids. Use hemorrhoid cream or witch hazel if your health care provider approves.  Avoid cat litter boxes and soil used by cats. These carry germs that can cause birth defects in the baby. If you have a cat, ask someone to clean the litter box for you.  To prepare for the arrival of your baby:  Take prenatal classes to understand, practice, and ask questions about the labor and delivery.  Make a trial run to the hospital.  Visit the hospital and tour the maternity area.  Arrange for maternity or paternity leave through employers.  Arrange for family and friends to take care of pets while you are in the hospital.  Purchase a rear-facing car seat and make sure you know how to install it in your car.  Pack your hospital bag.  Prepare the baby's nursery. Make sure to remove all pillows and stuffed animals from the baby's crib to prevent suffocation.  Visit your dentist if you have not gone during your pregnancy. Use a soft toothbrush to brush your teeth and be gentle when you floss.  Keep all prenatal follow-up visits as told by your health care provider. This is important. Contact a health care provider if:  You are unsure if you are in labor or if your water has broken.  You become dizzy.  You have mild pelvic cramps, pelvic pressure, or nagging pain in your abdominal area.  You have lower back pain.  You have persistent nausea, vomiting, or diarrhea.  You have an unusual or bad smelling vaginal discharge.  You have pain when you urinate. Get help right away if:  You have a fever.  You are leaking fluid from your vagina.  You have spotting or bleeding from your vagina.  You have severe abdominal pain or cramping.  You have rapid weight loss or weight gain.  You have  shortness of breath with chest pain.  You notice sudden or extreme swelling of your face, hands, ankles, feet, or legs.  Your baby makes fewer than 10 movements in 2 hours.  You have severe headaches that do not go away with medicine.  You have vision changes. Summary  The third trimester is from week 29 through week 40, months 7 through 9. The third trimester is a time when the unborn baby (fetus)   is growing rapidly.  During the third trimester, your discomfort may increase as you and your baby continue to gain weight. You may have abdominal, leg, and back pain, sleeping problems, and an increased need to urinate.  During the third trimester your breasts will keep growing and they will continue to become tender. A yellow fluid (colostrum) may leak from your breasts. This is the first milk you are producing for your baby.  False labor is a condition in which you feel small, irregular tightenings of the muscles in the womb (contractions) that eventually go away. These are called Braxton Hicks contractions. Contractions may last for hours, days, or even weeks before true labor sets in.  Signs of labor can include: abdominal cramps; regular contractions that start at 10 minutes apart and become stronger and more frequent with time; watery or bloody mucus discharge that comes from the vagina; increased pelvic pressure and dull back pain; and leaking of amniotic fluid. This information is not intended to replace advice given to you by your health care provider. Make sure you discuss any questions you have with your health care provider. Document Released: 06/14/2001 Document Revised: 11/26/2015 Document Reviewed: 08/21/2012 Elsevier Interactive Patient Education  2017 [employer]   Breastfeeding Challenges and Solutions Even though breastfeeding is natural, it can be challenging, especially in the first few weeks after childbirth. It is normal for problems to arise when starting to breastfeed  your new baby, even if you have breastfed before. This document provides some solutions to the most common breastfeeding challenges. Challenges and solutions Challenge-Cracked or Sore Nipples  Cracked or sore nipples are commonly experienced by breastfeeding mothers. Cracked or sore nipples often are caused by inadequate latching (when your baby's mouth attaches to your breast to breastfeed). Soreness can also happen if your baby is not positioned properly at your breast. Although nipple cracking and soreness are common during the first week after birth, nipple pain is never normal. If you experience nipple cracking or soreness that lasts longer than 1 week or nipple pain, call your health care provider or lactation consultant. Solution  Ensure proper latching and positioning of your baby by following the steps below:  Find a comfortable place to sit or lie down, with your neck and back well supported.  Place a pillow or rolled up blanket under your baby to bring him or her to the level of your breast (if you are seated).  Make sure that your baby's abdomen is facing your abdomen.  Gently massage your breast. With your fingertips, massage from your chest wall toward your nipple in a circular motion. This encourages milk flow. You may need to continue this action during the feeding if your milk flows slowly.  Support your breast with 4 fingers underneath and your thumb above your nipple. Make sure your fingers are well away from your nipple and your baby's mouth.  Stroke your baby's lips gently with your finger or nipple.  When your baby's mouth is open wide enough, quickly bring your baby to your breast, placing your entire nipple and as much of the colored area around your nipple (areola) as possible into your baby's mouth.  More areola should be visible above your baby's upper lip than below the lower lip.  Your baby's tongue should be between his or her lower gum and your breast.  Ensure  that your baby's mouth is correctly positioned around your nipple (latched). Your baby's lips should create a seal on your breast and be  turned out (everted).  It is common for your baby to suck for about 2-3 minutes in order to start the flow of breast milk. Signs that your baby has successfully latched on to your nipple include:  Quietly tugging or quietly sucking without causing you pain.  Swallowing heard between every 3-4 sucks.  Muscle movement above and in front of his or her ears with sucking. Signs that your baby has not successfully latched on to nipple include:  Sucking sounds or smacking sounds from your baby while nursing.  Nipple pain. Ensure that your breasts stay moisturized and healthy by:  Avoiding the use of soap on your nipples.  Wearing a supportive bra. Avoid wearing underwire-style bras or tight bras.  Air drying your nipples for 3-4 minutes after each feeding.  Using only cotton bra pads to absorb breast milk leakage. Leaking of breast milk between feedings is normal. Be sure to change the pads if they become soaked with milk.  Using lanolin on your nipples after nursing. Lanolin helps to maintain your skin's normal moisture barrier. If you use pure lanolin you do not need to wash it off before feeding your baby again. Pure lanolin is not toxic to your baby. You may also hand express a few drops of breast milk and gently massage that milk into your nipples, allowing it to air dry. Challenge-Breast Engorgement  Breast engorgement is the overfilling of your breasts with breast milk. In the first few weeks after giving birth, you may experience breast engorgement. Breast engorgement can make your breasts throb and feel hard, tightly stretched, warm, and tender. Engorgement peaks about the fifth day after you give birth. Having breast engorgement does not mean you have to stop breastfeeding your baby. Solution  Breastfeed when you feel the need to reduce the fullness  of your breasts or when your baby shows signs of hunger. This is called "breastfeeding on demand."  Newborns (babies younger than 4 weeks) often breastfeed every 1-3 hours during the day. You may need to awaken your baby to feed if he or she is asleep at a feeding time.  Do not allow your baby to sleep longer than 5 hours during the night without a feeding.  Pump or hand express breast milk before breastfeeding to soften your breast, areola, and nipple.  Apply warm, moist heat (in the shower or with warm water-soaked hand towels) just before feeding or pumping, or massage your breast before or during breastfeeding. This increases circulation and helps your milk to flow.  Completely empty your breasts when breastfeeding or pumping. Afterward, wear a snug bra (nursing or regular) or tank top for 1-2 days to signal your body to slightly decrease milk production. Only wear snug bras or tank tops to treat engorgement. Tight bras typically should be avoided by breastfeeding mothers. Once engorgement is relieved, return to wearing regular, loose-fitting clothes.  Apply ice packs to your breasts to lessen the pain from engorgement and relieve swelling, unless the ice is uncomfortable for you.  Do not delay feedings. Try to relax when it is time to feed your baby. This helps to trigger your "let-down reflex," which releases milk from your breast.  Ensure your baby is latched on to your breast and positioned properly while breastfeeding.  Allow your baby to remain at your breast as long as he or she is latched on well and actively sucking. Your baby will let you know when he or she is done breastfeeding by pulling away from your  breast or falling asleep.  Avoid introducing bottles or pacifiers to your baby in the early weeks of breastfeeding. Wait to introduce these things until after resolving any breastfeeding challenges.  Try to pump your milk on the same schedule as when your baby would breastfeed if  you are returning to work or away from home for an extended period.  Drink plenty of fluids to avoid dehydration, which can eventually put you at greater risk of breast engorgement. If you follow these suggestions, your engorgement should improve in 24-48 hours. If you are still experiencing difficulty, call your lactation consultant or health care provider. Challenge-Plugged Milk Ducts  Plugged milk ducts occur when the duct does not drain milk effectively and becomes swollen. Wearing a tight-fitting nursing bra or having difficulty with latching may cause plugged milk ducts. Not drinking enough water (8-10 c [1.9-2.4 L] per day) can contribute to plugged milk ducts. Once a duct has become plugged, hard lumps, soreness, and redness may develop in your breast. Solution  Do not delay feedings. Feed your baby frequently and try to empty your breasts of milk at each feeding. Try breastfeeding from the affected side first so there is a better chance that the milk will drain completely from that breast. Apply warm, moist towels to your breasts for 5-10 minutes before feeding. Alternatively, a hot shower right before breastfeeding can provide the moist heat that can encourage milk flow. Gentle massage of the sore area before and during a feeding may also help. Avoid wearing tight clothing or bras that put pressure on your breasts. Wear bras that offer good support to your breasts, but avoid underwire bras. If you have a plugged milk duct and develop a fever, you need to see your health care provider. Challenge-Mastitis  Mastitis is inflammation of your breast. It usually is caused by a bacterial infection and can cause flu-like symptoms. You may develop redness in your breast and a fever. Often when mastitis occurs, your breast becomes firm, warm, and very painful. The most common causes of mastitis are poor latching, ineffective sucking from your baby, consistent pressure on your breast (possibly from wearing a  tight-fitting bra or shirt that restricts the milk flow), unusual stress or fatigue, or missed feedings. Solution  You will be given antibiotic medicine to treat the infection. It is still important to breastfeed frequently to empty your breasts. Continuing to breastfeed while you recover from mastitis will not harm your baby. Make sure your baby is positioned properly during every feeding. Apply moist heat to your breasts for a few minutes before feeding to help the milk flow and to help your breasts empty more easily. Challenge-Thrush  Ginette Pitman is a yeast infection that can form on your nipples, in your breast, or in your baby's mouth. It causes itching, soreness, burning or stabbing pain, and sometimes a rash. Solution  You will be given a medicated ointment for your nipples, and your baby will be given a liquid medicine for his or her mouth. It is important that you and your baby are treated at the same time because thrush can be passed between you and your baby. Change disposable nursing pads often. Any bras, towels, or clothing that come in contact with infected areas of your body or your baby's body need to be washed in very hot water every day. Wash your hands and your baby's hands often. All pacifiers, bottle nipples, or toys your baby puts in his or her mouth should be boiled once a  day for 20 minutes. After 1 week of treatment, discard pacifiers and bottle nipples and buy new ones. All breast pump parts that touch the milk need to be boiled for 20 minutes every day. Challenge-Low Milk Supply  You may not be producing enough milk if your baby is not gaining the proper amount of weight. Breast milk production is based on a supply-and-demand system. Your milk supply depends on how frequently and effectively your baby empties your breast. Solution  The more you breastfeed and pump, the more breast milk you will produce. It is important that your baby empties at least one of your breasts at each  feeding. If this is not happening, then use a breast pump or hand express any milk that remains. This will help to drain as much milk as possible at each feeding. It will also signal your body to produce more milk. If your baby is not emptying your breasts, it may be due to latching, sucking, or positioning problems. If low milk supply continues after addressing these issues, contact your health care provider or a lactation specialist as soon as possible. Challenge-Inverted or Flat Nipples  Some women have nipples that turn inward instead of protruding outward. Other women have nipples that are flat. Inverted or flat nipples can sometimes make it more difficult for your baby to latch onto your breast. Solution  You may be given a small device that pulls out inverted nipples. This device should be applied right before your baby is brought to your breast. You can also try using a breast pump for a short time before placing the baby at your breast. The pump can pull your nipple outwards to help your infant latch more easily. The baby's sucking motion will help the inverted nipple protrude as well. If you have flat nipples, encourage your baby to latch onto your breast and feed frequently in the early days after birth. This will give your baby practice latching on correctly while your breast is still soft. When your milk supply increases, between the second and fifth day after birth and your breasts become full, your baby will have an easier time latching. Contact a lactation consultant if you still have concerns. She or he can teach you additional techniques to address breastfeeding problems related to nipple shape and position. Where to find more information: [employer]: www.llli.org This information is not intended to replace advice given to you by your health care provider. Make sure you discuss any questions you have with your health care provider. Document Released: 12/12/2005 Document  Revised: 12/02/2015 Document Reviewed: 12/14/2012 Elsevier Interactive Patient Education  2017 [employer].  Contraception Choices Contraception (birth control) is the use of any methods or devices to prevent pregnancy. Below are some methods to help avoid pregnancy. Hormonal methods  Contraceptive implant. This is a thin, plastic tube containing progesterone hormone. It does not contain estrogen hormone. Your health care provider inserts the tube in the inner part of the upper arm. The tube can remain in place for up to 3 years. After 3 years, the implant must be removed. The implant prevents the ovaries from releasing an egg (ovulation), thickens the cervical mucus to prevent sperm from entering the uterus, and thins the lining of the inside of the uterus.  Progesterone-only injections. These injections are given every 3 months by your health care provider to prevent pregnancy. This synthetic progesterone hormone stops the ovaries from releasing eggs. It also thickens cervical mucus and changes the uterine  lining. This makes it harder for sperm to survive in the uterus.  Birth control pills. These pills contain estrogen and progesterone hormone. They work by preventing the ovaries from releasing eggs (ovulation). They also cause the cervical mucus to thicken, preventing the sperm from entering the uterus. Birth control pills are prescribed by a health care provider.Birth control pills can also be used to treat heavy periods.  Minipill. This type of birth control pill contains only the progesterone hormone. They are taken every day of each month and must be prescribed by your health care provider.  Birth control patch. The patch contains hormones similar to those in birth control pills. It must be changed once a week and is prescribed by a health care provider.  Vaginal ring. The ring contains hormones similar to those in birth control pills. It is left in the vagina for 3 weeks, removed for 1  week, and then a new one is put back in place. The patient must be comfortable inserting and removing the ring from the vagina.A health care provider's prescription is necessary.  Emergency contraception. Emergency contraceptives prevent pregnancy after unprotected sexual intercourse. This pill can be taken right after sex or up to 5 days after unprotected sex. It is most effective the sooner you take the pills after having sexual intercourse. Most emergency contraceptive pills are available without a prescription. Check with your pharmacist. Do not use emergency contraception as your only form of birth control. Barrier methods  Female condom. This is a thin sheath (latex or rubber) that is worn over the penis during sexual intercourse. It can be used with spermicide to increase effectiveness.  Female condom. This is a soft, loose-fitting sheath that is put into the vagina before sexual intercourse.  Diaphragm. This is a soft, latex, dome-shaped barrier that must be fitted by a health care provider. It is inserted into the vagina, along with a spermicidal jelly. It is inserted before intercourse. The diaphragm should be left in the vagina for 6 to 8 hours after intercourse.  Cervical cap. This is a round, soft, latex or plastic cup that fits over the cervix and must be fitted by a health care provider. The cap can be left in place for up to 48 hours after intercourse.  Sponge. This is a soft, circular piece of polyurethane foam. The sponge has spermicide in it. It is inserted into the vagina after wetting it and before sexual intercourse.  Spermicides. These are chemicals that kill or block sperm from entering the cervix and uterus. They come in the form of creams, jellies, suppositories, foam, or tablets. They do not require a prescription. They are inserted into the vagina with an applicator before having sexual intercourse. The process must be repeated every time you have sexual  intercourse. Intrauterine contraception  Intrauterine device (IUD). This is a T-shaped device that is put in a woman's uterus during a menstrual period to prevent pregnancy. There are 2 types:  Copper IUD. This type of IUD is wrapped in copper wire and is placed inside the uterus. Copper makes the uterus and fallopian tubes produce a fluid that kills sperm. It can stay in place for 10 years.  Hormone IUD. This type of IUD contains the hormone progestin (synthetic progesterone). The hormone thickens the cervical mucus and prevents sperm from entering the uterus, and it also thins the uterine lining to prevent implantation of a fertilized egg. The hormone can weaken or kill the sperm that get into the uterus. It  can stay in place for 3-5 years, depending on which type of IUD is used. Permanent methods of contraception  Female tubal ligation. This is when the woman's fallopian tubes are surgically sealed, tied, or blocked to prevent the egg from traveling to the uterus.  Hysteroscopic sterilization. This involves placing a small coil or insert into each fallopian tube. Your doctor uses a technique called hysteroscopy to do the procedure. The device causes scar tissue to form. This results in permanent blockage of the fallopian tubes, so the sperm cannot fertilize the egg. It takes about 3 months after the procedure for the tubes to become blocked. You must use another form of birth control for these 3 months.  Female sterilization. This is when the female has the tubes that carry sperm tied off (vasectomy).This blocks sperm from entering the vagina during sexual intercourse. After the procedure, the man can still ejaculate fluid (semen). Natural planning methods  Natural family planning. This is not having sexual intercourse or using a barrier method (condom, diaphragm, cervical cap) on days the woman could become pregnant.  Calendar method. This is keeping track of the length of each menstrual cycle  and identifying when you are fertile.  Ovulation method. This is avoiding sexual intercourse during ovulation.  Symptothermal method. This is avoiding sexual intercourse during ovulation, using a thermometer and ovulation symptoms.  Post-ovulation method. This is timing sexual intercourse after you have ovulated. Regardless of which type or method of contraception you choose, it is important that you use condoms to protect against the transmission of sexually transmitted infections (STIs). Talk with your health care provider about which form of contraception is most appropriate for you. This information is not intended to replace advice given to you by your health care provider. Make sure you discuss any questions you have with your health care provider. Document Released: 06/20/2005 Document Revised: 11/26/2015 Document Reviewed: 12/13/2012 Elsevier Interactive Patient Education  2017 [employer].

## 2016-08-12 NOTE — Progress Notes (Signed)
   PRENATAL VISIT NOTE  Subjective:  Patricia Clarke is a 21 y.o. G1P0 at 4756w1d being seen today for ongoing prenatal care.  She is currently monitored for the following issues for this high-risk pregnancy and has Supervision of normal pregnancy, antepartum and Chlamydia infection affecting pregnancy on her problem list.  Patient reports no complaints.  Contractions: Not present. Vag. Bleeding: None.  Movement: Present. Denies leaking of fluid.   The following portions of the patient's history were reviewed and updated as appropriate: allergies, current medications, past family history, past medical history, past social history, past surgical history and problem list. Problem list updated.  Objective:   Vitals:   08/12/16 0808  BP: 121/82  Pulse: 87  Weight: 174 lb (78.9 kg)    Fetal Status: Fetal Heart Rate (bpm): 158 Fundal Height: 28 cm Movement: Present     General:  Alert, oriented and cooperative. Patient is in no acute distress.  Skin: Skin is warm and dry. No rash noted.   Cardiovascular: Normal heart rate noted  Respiratory: Normal respiratory effort, no problems with respiration noted  Abdomen: Soft, gravid, appropriate for gestational age. Pain/Pressure: Present     Pelvic:  Cervical exam deferred        Extremities: Normal range of motion.  Edema: None  Mental Status: Normal mood and affect. Normal behavior. Normal judgment and thought content.   Assessment and Plan:  Pregnancy: G1P0 at 8056w1d  1. Normal pregnancy in second trimester  - 2Hr GTT w/ 1 Hr Carpenter 75 g - HIV antibody (with reflex) - RPR - CBC - Tdap vaccine greater than or equal to 7yo IM - Urine cytology ancillary only - US MFM OB FOLLOW UP; Future  2. [redacted] weeks gestation of pregnancy  - US MFM OB FOLLOW UP; Future  3. Evaluate anatomy not seen on prior sonogram  - US MFM OB FOLLOW UP; Future  4. Chlamydia infection affecting pregnancy, antepartum  - GC/Chlamydia probe amp ([facility]not [facility]  5. Supervision of normal first pregnancy, antepartum   Preterm labor symptoms and general obstetric precautions including but not limited to vaginal bleeding, contractions, leaking of fluid and fetal movement were reviewed in detail with the patient. Please refer to After Visit Summary for other counseling recommendations.  Pt urged to attend all prenatal visits.  Return in about 2 weeks (around 08/26/2016) for ROB.   Dorathy KinsmanVirginia Francina Beery, CNM

## 2016-08-13 LAB — 2HR GTT W 1 HR, CARPENTER, 75 G
GLUCOSE, 1 HR, GEST: 151 mg/dL (ref <180)
GLUCOSE, FASTING, GEST: 69 mg/dL (ref 65–91)
Glucose, 2 Hr, Gest: 98 mg/dL (ref <153)

## 2016-08-13 LAB — RPR

## 2016-08-13 LAB — HIV ANTIBODY (ROUTINE TESTING W REFLEX): HIV 1&2 Ab, 4th Generation: NONREACTIVE

## 2016-08-15 ENCOUNTER — Other Ambulatory Visit: Payer: Self-pay | Admitting: Advanced Practice Midwife

## 2016-08-15 LAB — CERVICOVAGINAL ANCILLARY ONLY
Chlamydia: POSITIVE — AB
NEISSERIA GONORRHEA: NEGATIVE

## 2016-08-15 MED ORDER — AZITHROMYCIN 500 MG PO TABS: 1000.0000 mg | ORAL_TABLET | Freq: Once | ORAL | 0 refills | Status: AC

## 2016-08-15 NOTE — Progress Notes (Unsigned)
Please notify pt of Dx, Rx. Partner needs to be retreated. No IC until at least 1 week after both are Tx'd. Always use condoms.

## 2016-08-16 ENCOUNTER — Telehealth: Payer: Self-pay

## 2016-08-16 NOTE — Telephone Encounter (Signed)
Dorathy KinsmanVirginia Smith, CNM asked me to inform pt of Chlamydia diagnosis and to let her know that her medication has been sent to her pharmacy. She also asked me to tell her to have her partner treated and to not have intercourse for atleast 1 week after both have been treated and to use condoms. The pt did not answer the phone so I left a message asking her to call the office.

## 2016-08-16 NOTE — Telephone Encounter (Signed)
Dorathy KinsmanVirginia Clarke, CNM asked me to inform pt of Chlamydia diagnosis and to let her know that her medication has been sent to her pharmacy. She also asked me to tell her to have her partner treated and to not have intercourse for atleast 1 week after both have been treated and to use condoms. I spoke with pt and she is aware of this.

## 2016-08-24 ENCOUNTER — Encounter: Payer: Self-pay | Admitting: Obstetrics & Gynecology

## 2016-08-24 ENCOUNTER — Ambulatory Visit (INDEPENDENT_AMBULATORY_CARE_PROVIDER_SITE_OTHER): Payer: Medicaid Other | Admitting: Obstetrics & Gynecology

## 2016-08-24 VITALS — BP 118/78 | HR 95 | Wt 175.0 lb

## 2016-08-24 DIAGNOSIS — A749 Chlamydial infection, unspecified: Secondary | ICD-10-CM

## 2016-08-24 DIAGNOSIS — Z3400 Encounter for supervision of normal first pregnancy, unspecified trimester: Secondary | ICD-10-CM

## 2016-08-24 DIAGNOSIS — O98813 Other maternal infectious and parasitic diseases complicating pregnancy, third trimester: Principal | ICD-10-CM

## 2016-08-24 DIAGNOSIS — O0933 Supervision of pregnancy with insufficient antenatal care, third trimester: Secondary | ICD-10-CM

## 2016-08-24 DIAGNOSIS — O98313 Other infections with a predominantly sexual mode of transmission complicating pregnancy, third trimester: Secondary | ICD-10-CM

## 2016-08-24 NOTE — Patient Instructions (Signed)
Chlamydia, Female Chlamydia is an infection. It is spread from one person to another person during sexual contact. This infection can be in the cervix, urine tube (urethra), throat, or bottom (rectum). This infection needs treatment. HOME CARE   Take your medicines (antibiotics) as told. Finish them even if you start to feel better.  Only take medicine as told by your doctor.  Tell your sex partner(s) that you have chlamydia. They must also be treated.  Do not have sex until your doctor says it is okay.  Rest.  Eat healthy. Drink enough fluids to keep your pee (urine) clear or pale yellow.  Keep all doctor visits as told. GET HELP IF:  You have pain when you pee.  You have belly pain.  You have vaginal discharge.  You have pain during sex.  You have bleeding between periods and after sex.  You have a fever. GET HELP RIGHT AWAY IF:   You feel sick to your stomach (nauseous) or you throw up (vomit).  You sweat much more than normal (diaphoresis).  You have trouble swallowing. This information is not intended to replace advice given to you by your health care provider. Make sure you discuss any questions you have with your health care provider. Document Released: 03/29/2008 Document Revised: 10/12/2015 Document Reviewed: 02/25/2013 Elsevier Interactive Patient Education  2017 [employer] Levonorgestrel intrauterine device (IUD) What is this medicine? LEVONORGESTREL IUD (LEE voe nor jes trel) is a contraceptive (birth control) device. The device is placed inside the uterus by a healthcare professional. It is used to prevent pregnancy. This device can also be used to treat heavy bleeding that occurs during your period. This medicine may be used for other purposes; ask your health care provider or pharmacist if you have questions. COMMON BRAND NAME(S): Cameron AliKyleena, LILETTA, Mirena, Skyla What should I tell my health care provider before I take this medicine? They need to know  if you have any of these conditions: -abnormal Pap smear -cancer of the breast, uterus, or cervix -diabetes -endometritis -genital or pelvic infection now or in the past -have more than one sexual partner or your partner has more than one partner -heart disease -history of an ectopic or tubal pregnancy -immune system problems -IUD in place -liver disease or tumor -problems with blood clots or take blood-thinners -seizures -use intravenous drugs -uterus of unusual shape -vaginal bleeding that has not been explained -an unusual or allergic reaction to levonorgestrel, other hormones, silicone, or polyethylene, medicines, foods, dyes, or preservatives -pregnant or trying to get pregnant -breast-feeding How should I use this medicine? This device is placed inside the uterus by a health care professional. Talk to your pediatrician regarding the use of this medicine in children. Special care may be needed. Overdosage: If you think you have taken too much of this medicine contact a poison control center or emergency room at once. NOTE: This medicine is only for you. Do not share this medicine with others. What if I miss a dose? This does not apply. Depending on the brand of device you have inserted, the device will need to be replaced every 3 to 5 years if you wish to continue using this type of birth control. What may interact with this medicine? Do not take this medicine with any of the following medications: -amprenavir -bosentan -fosamprenavir This medicine may also interact with the following medications: -aprepitant -barbiturate medicines for inducing sleep or treating seizures -bexarotene -griseofulvin -medicines to treat seizures like carbamazepine, ethotoin, felbamate, oxcarbazepine, phenytoin, topiramate -  modafinil -pioglitazone -rifabutin -rifampin -rifapentine -some medicines to treat HIV infection like atazanavir, indinavir, lopinavir, nelfinavir, tipranavir,  ritonavir -St. John's wort -warfarin This list may not describe all possible interactions. Give your health care provider a list of all the medicines, herbs, non-prescription drugs, or dietary supplements you use. Also tell them if you smoke, drink alcohol, or use illegal drugs. Some items may interact with your medicine. What should I watch for while using this medicine? Visit your doctor or health care professional for regular check ups. See your doctor if you or your partner has sexual contact with others, becomes HIV positive, or gets a sexual transmitted disease. This product does not protect you against HIV infection (AIDS) or other sexually transmitted diseases. You can check the placement of the IUD yourself by reaching up to the top of your vagina with clean fingers to feel the threads. Do not pull on the threads. It is a good habit to check placement after each menstrual period. Call your doctor right away if you feel more of the IUD than just the threads or if you cannot feel the threads at all. The IUD may come out by itself. You may become pregnant if the device comes out. If you notice that the IUD has come out use a backup birth control method like condoms and call your health care provider. Using tampons will not change the position of the IUD and are okay to use during your period. This IUD can be safely scanned with magnetic resonance imaging (MRI) only under specific conditions. Before you have an MRI, tell your healthcare provider that you have an IUD in place, and which type of IUD you have in place. What side effects may I notice from receiving this medicine? Side effects that you should report to your doctor or health care professional as soon as possible: -allergic reactions like skin rash, itching or hives, swelling of the face, lips, or tongue -fever, flu-like symptoms -genital sores -high blood pressure -no menstrual period for 6 weeks during use -pain, swelling, warmth  in the leg -pelvic pain or tenderness -severe or sudden headache -signs of pregnancy -stomach cramping -sudden shortness of breath -trouble with balance, talking, or walking -unusual vaginal bleeding, discharge -yellowing of the eyes or skin Side effects that usually do not require medical attention (report to your doctor or health care professional if they continue or are bothersome): -acne -breast pain -change in sex drive or performance -changes in weight -cramping, dizziness, or faintness while the device is being inserted -headache -irregular menstrual bleeding within first 3 to 6 months of use -nausea This list may not describe all possible side effects. Call your doctor for medical advice about side effects. You may report side effects to FDA at 1-800-FDA-1088. Where should I keep my medicine? This does not apply. NOTE: This sheet is a summary. It may not cover all possible information. If you have questions about this medicine, talk to your doctor, pharmacist, or health care provider.  2017 Elsevier/Gold Standard (2015-12-11 13:46:37)

## 2016-08-24 NOTE — Progress Notes (Signed)
   PRENATAL VISIT NOTE  Subjective:  Patricia Clarke is a 21 y.o. G1P0 at 3360w6d being seen today for ongoing prenatal care.  She is currently monitored for the following issues for this high-risk pregnancy and has Supervision of normal pregnancy, antepartum; Chlamydia infection affecting pregnancy; and Limited prenatal care in third trimester on her problem list.  Patient reports no complaints.  Contractions: Not present. Vag. Bleeding: None.  Movement: Present. Denies leaking of fluid.   The following portions of the patient's history were reviewed and updated as appropriate: allergies, current medications, past family history, past medical history, past social history, past surgical history and problem list. Problem list updated.  Objective:   Vitals:   08/24/16 1035  BP: 118/78  Pulse: 95  Weight: 175 lb (79.4 kg)    Fetal Status: Fetal Heart Rate (bpm): 144   Movement: Present     General:  Alert, oriented and cooperative. Patient is in no acute distress.  Skin: Skin is warm and dry. No rash noted.   Cardiovascular: Normal heart rate noted  Respiratory: Normal respiratory effort, no problems with respiration noted  Abdomen: Soft, gravid, appropriate for gestational age. Pain/Pressure: Absent     Pelvic:  Cervical exam deferred        Extremities: Normal range of motion.  Edema: None  Mental Status: Normal mood and affect. Normal behavior. Normal judgment and thought content.   Assessment and Plan:  Pregnancy: G1P0 at 4560w6d  1. Supervision of normal first pregnancy, antepartum -Noncomplaince with treatment.  Needs f/u US rescheduled for anatomy   2. Chlamydia infection affecting pregnancy in third trimester TOC was positive 08/12/16.  Pt nor her partner got treated.  Rx is at pharmacy ready for pick up (per RN CLark)  3. Limited prenatal care in third trimester Noncompliance; SW consult at delivery.  Preterm labor symptoms and general obstetric precautions including  but not limited to vaginal bleeding, contractions, leaking of fluid and fetal movement were reviewed in detail with the patient. Please refer to After Visit Summary for other counseling recommendations.  Return in about 2 weeks (around 09/07/2016).   Lesly DukesKelly H Dillyn Joaquin, MD

## 2016-08-26 ENCOUNTER — Encounter: Payer: Medicaid Other | Admitting: Advanced Practice Midwife

## 2016-09-07 ENCOUNTER — Encounter: Payer: Self-pay | Admitting: Obstetrics & Gynecology

## 2016-09-07 ENCOUNTER — Ambulatory Visit (INDEPENDENT_AMBULATORY_CARE_PROVIDER_SITE_OTHER): Payer: Medicaid Other | Admitting: Obstetrics & Gynecology

## 2016-09-07 VITALS — BP 135/90 | HR 84 | Wt 181.0 lb

## 2016-09-07 DIAGNOSIS — A749 Chlamydial infection, unspecified: Secondary | ICD-10-CM

## 2016-09-07 DIAGNOSIS — O98313 Other infections with a predominantly sexual mode of transmission complicating pregnancy, third trimester: Secondary | ICD-10-CM

## 2016-09-07 DIAGNOSIS — Z3400 Encounter for supervision of normal first pregnancy, unspecified trimester: Secondary | ICD-10-CM

## 2016-09-07 DIAGNOSIS — O0933 Supervision of pregnancy with insufficient antenatal care, third trimester: Secondary | ICD-10-CM

## 2016-09-07 DIAGNOSIS — O98813 Other maternal infectious and parasitic diseases complicating pregnancy, third trimester: Principal | ICD-10-CM

## 2016-09-07 NOTE — Progress Notes (Signed)
   PRENATAL VISIT NOTE  Subjective:  Patricia Clarke is a 21 y.o. G1P0 at 4971w6d being seen today for ongoing prenatal care.  She is currently monitored for the following issues for this low-risk pregnancy and has Supervision of normal pregnancy, antepartum; Chlamydia infection affecting pregnancy; and Limited prenatal care in third trimester on her problem list.  Patient reports no complaints.  Contractions: Not present. Vag. Bleeding: None.  Movement: Present. Denies leaking of fluid.   The following portions of the patient's history were reviewed and updated as appropriate: allergies, current medications, past family history, past medical history, past social history, past surgical history and problem list. Problem list updated.  Objective:   Vitals:   09/07/16 1022  BP: 135/90  Pulse: 84  Weight: 181 lb (82.1 kg)    Fetal Status: Fetal Heart Rate (bpm): 141   Movement: Present     General:  Alert, oriented and cooperative. Patient is in no acute distress.  Skin: Skin is warm and dry. No rash noted.   Cardiovascular: Normal heart rate noted  Respiratory: Normal respiratory effort, no problems with respiration noted  Abdomen: Soft, gravid, appropriate for gestational age. Pain/Pressure: Absent     Pelvic:  Cervical exam deferred        Extremities: Normal range of motion.  Edema: None  Mental Status: Normal mood and affect. Normal behavior. Normal judgment and thought content.   Assessment and Plan:  Pregnancy: G1P0 at 6271w6d  1. Chlamydia infection affecting pregnancy in third trimester   2. Limited prenatal care in third trimester   3. Supervision of normal first pregnancy, antepartum   Preterm labor symptoms and general obstetric precautions including but not limited to vaginal bleeding, contractions, leaking of fluid and fetal movement were reviewed in detail with the patient. Please refer to After Visit Summary for other counseling recommendations.  No Follow-up  on file.   Allie BossierMyra C Amaira Safley, MD

## 2016-09-07 NOTE — Progress Notes (Signed)
routi 

## 2016-09-21 ENCOUNTER — Ambulatory Visit (INDEPENDENT_AMBULATORY_CARE_PROVIDER_SITE_OTHER): Payer: Medicaid Other | Admitting: Obstetrics & Gynecology

## 2016-09-21 ENCOUNTER — Other Ambulatory Visit (HOSPITAL_COMMUNITY)
Admission: RE | Admit: 2016-09-21 | Discharge: 2016-09-21 | Disposition: A | Discharge status: Home / Self Care | Payer: Medicaid Other | Source: Ambulatory Visit | Attending: Obstetrics & Gynecology | Admitting: Obstetrics & Gynecology

## 2016-09-21 VITALS — BP 118/82 | HR 91 | Wt 186.0 lb

## 2016-09-21 DIAGNOSIS — O26893 Other specified pregnancy related conditions, third trimester: Secondary | ICD-10-CM | POA: Diagnosis present

## 2016-09-21 DIAGNOSIS — N898 Other specified noninflammatory disorders of vagina: Secondary | ICD-10-CM | POA: Insufficient documentation

## 2016-09-21 DIAGNOSIS — Z3A33 33 weeks gestation of pregnancy: Secondary | ICD-10-CM | POA: Insufficient documentation

## 2016-09-21 DIAGNOSIS — Z3483 Encounter for supervision of other normal pregnancy, third trimester: Secondary | ICD-10-CM | POA: Diagnosis not present

## 2016-09-21 NOTE — Progress Notes (Signed)
   PRENATAL VISIT NOTE  Subjective:  Patricia Clarke is a 21 y.o. G1P0 at 1937w6d being seen today for ongoing prenatal care.  She is currently monitored for the following issues for this high-risk pregnancy and has Supervision of normal pregnancy, antepartum; Chlamydia infection affecting pregnancy; and Limited prenatal care in third trimester on her problem list.  Patient reports vaginal irritation and itching.  Contractions: Irritability. Vag. Bleeding: None.  Movement: Present. Denies leaking of fluid.   The following portions of the patient's history were reviewed and updated as appropriate: allergies, current medications, past family history, past medical history, past social history, past surgical history and problem list. Problem list updated.  Objective:   Vitals:   09/21/16 1104  BP: 118/82  Pulse: 91  Weight: 186 lb (84.4 kg)    Fetal Status: Fetal Heart Rate (bpm): 144  Fundal Height 31cm Movement: Present     General:  Alert, oriented and cooperative. Patient is in no acute distress.  Skin: Skin is warm and dry. No rash noted.   Cardiovascular: Normal heart rate noted  Respiratory: Normal respiratory effort, no problems with respiration noted  Abdomen: Soft, gravid, appropriate for gestational age. Pain/Pressure: Present     Pelvic:  Cervical exam performed, cervix is closed, copious amounts of white discharge and    Vagina tenderness present during exam.    Extremities: Normal range of motion.  Edema: None  Mental Status: Normal mood and affect. Normal behavior. Normal judgment and thought content.   Assessment and Plan:  Pregnancy: G1P0 at 5937w6d with vaginal discharge  1-TOC for recent chlamydial infection.  Also tested for vaginitis.   2.  No evidence of preterm labor--cervix closed. 3.  Pt may be moving to [place] for delivery.  Pt encoruaged to let us know so we can send records with her or transfer them to her new OB.  [facility].  Preterm labor symptoms and general obstetric precautions including but not limited to vaginal bleeding, contractions, leaking of fluid and fetal movement were reviewed in detail with the patient. Please refer to After Visit Summary for other counseling recommendations.   RTC 2 weeks  Tereasa CoopHannah Elane Peabody, RN

## 2016-09-22 LAB — CERVICOVAGINAL ANCILLARY ONLY
BACTERIAL VAGINITIS: NEGATIVE
Candida vaginitis: POSITIVE — AB
Chlamydia: NEGATIVE
NEISSERIA GONORRHEA: NEGATIVE

## 2016-09-27 ENCOUNTER — Encounter: Payer: Self-pay | Admitting: *Deleted

## 2016-09-27 ENCOUNTER — Telehealth: Payer: Self-pay | Admitting: *Deleted

## 2016-09-27 DIAGNOSIS — N760 Acute vaginitis: Principal | ICD-10-CM

## 2016-09-27 DIAGNOSIS — B9689 Other specified bacterial agents as the cause of diseases classified elsewhere: Secondary | ICD-10-CM

## 2016-09-27 MED ORDER — METRONIDAZOLE 500 MG PO TABS: 500.0000 mg | ORAL_TABLET | Freq: Two times a day (BID) | ORAL | 0 refills | Status: AC

## 2016-09-27 NOTE — Telephone Encounter (Signed)
-----   Message from Lesly DukesKelly H Leggett, MD sent at 09/23/2016 12:22 PM EDT ----- Treat with Diflucan per protocol

## 2016-09-27 NOTE — Telephone Encounter (Signed)
LM on voicemail of positive BV and that [facility] [place].

## 2016-10-06 ENCOUNTER — Encounter: Payer: Medicaid Other | Admitting: Obstetrics & Gynecology

## 2017-03-21 ENCOUNTER — Encounter (HOSPITAL_COMMUNITY): Payer: Self-pay

## 2017-04-01 IMAGING — US US MFM OB COMP +14 WKS
1 series · 14 of 28 positions shown · non-contrast
Comparison: none

[Series 1: us mfm ob comp +14 wks · 14 of 75 slices shown]
[im 3/75]
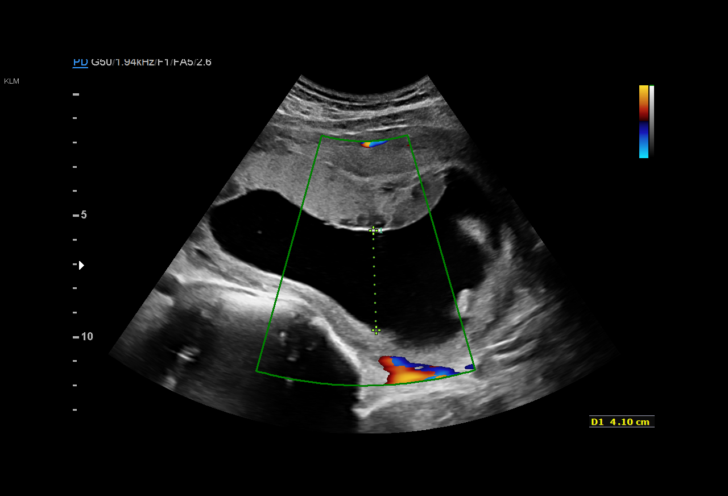
[im 9/75]
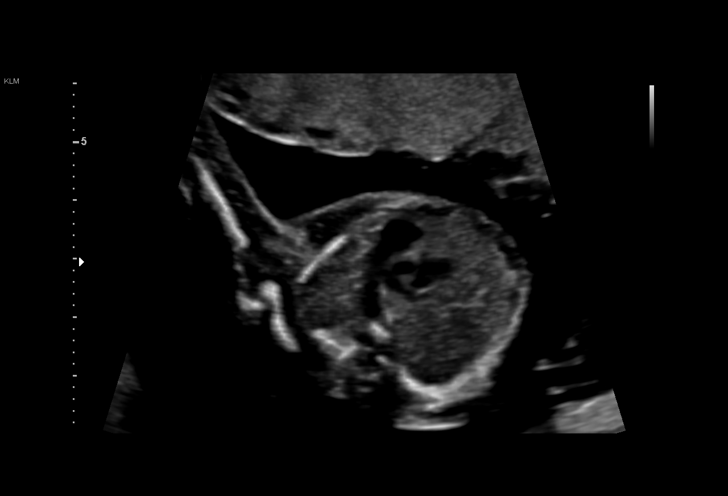
[im 14/75]
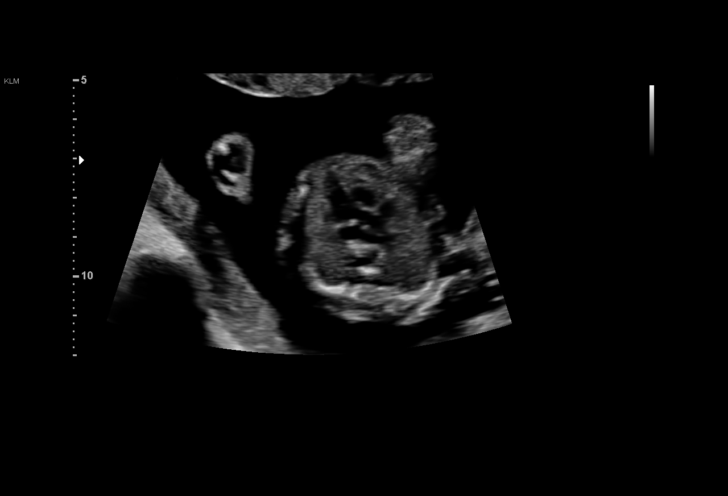
[im 20/75]
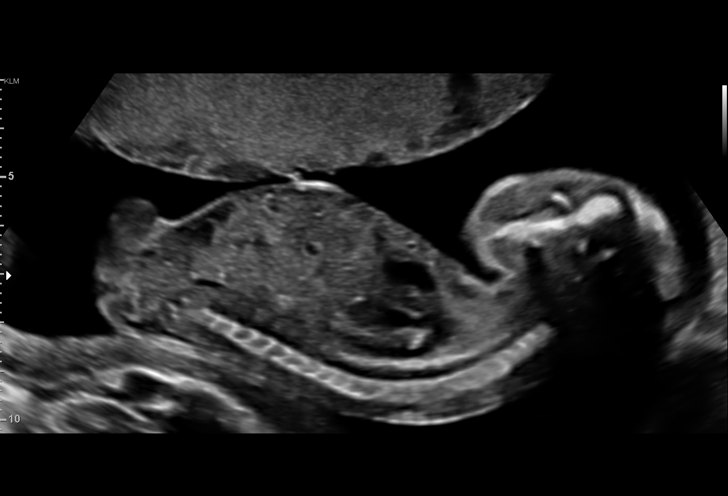
[im 25/75]
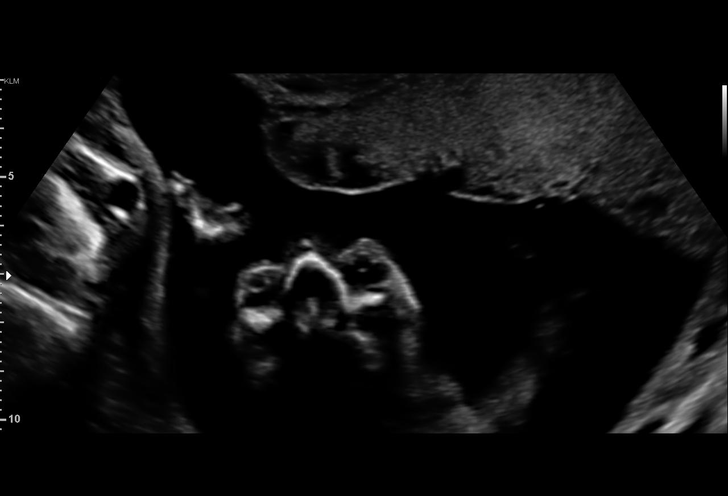
[im 31/75]
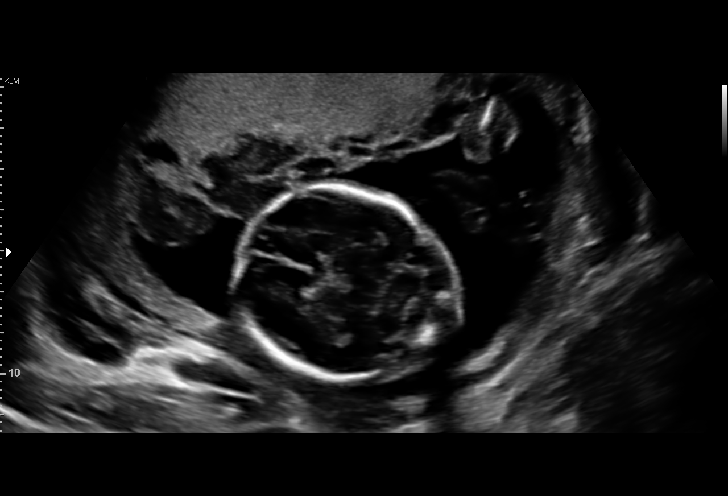
[im 36/75]
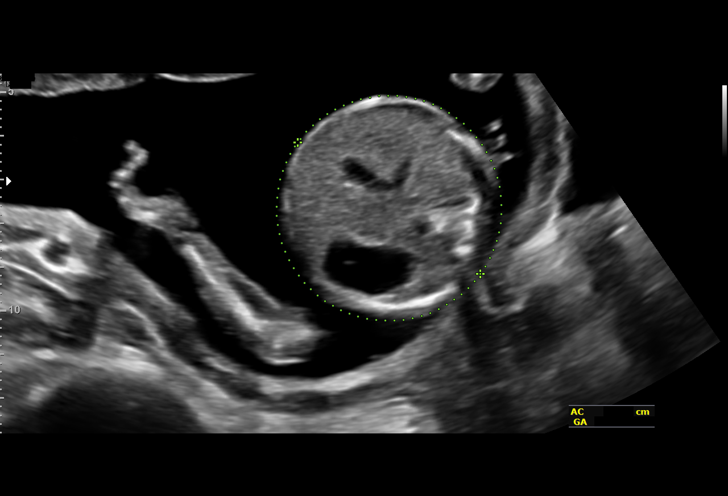
[im 42/75]
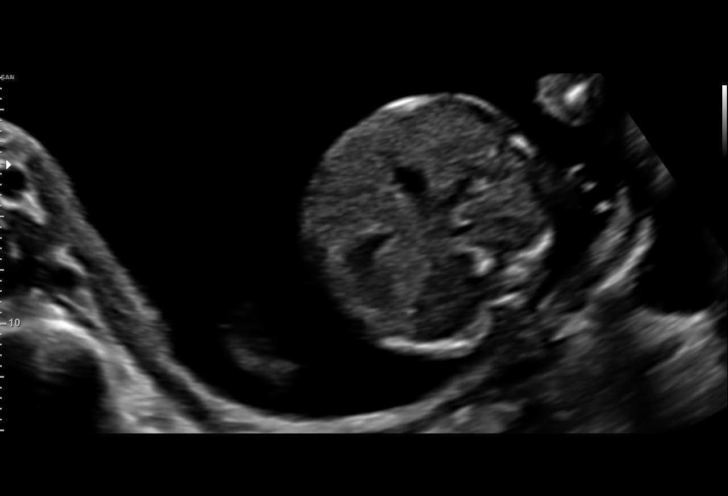
[im 47/75]
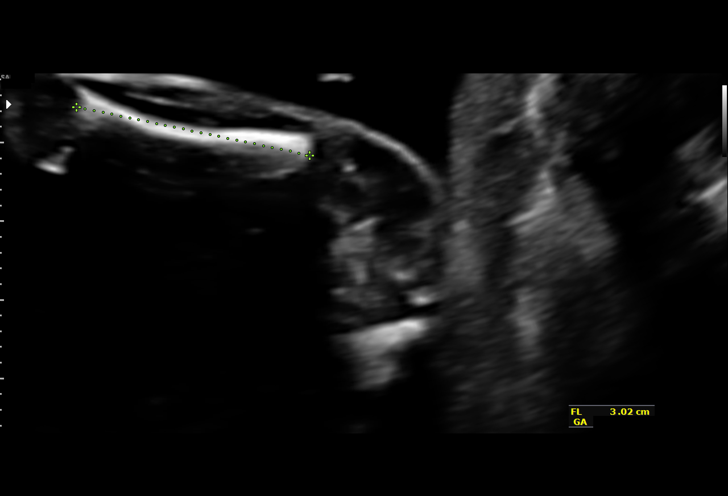
[im 53/75]
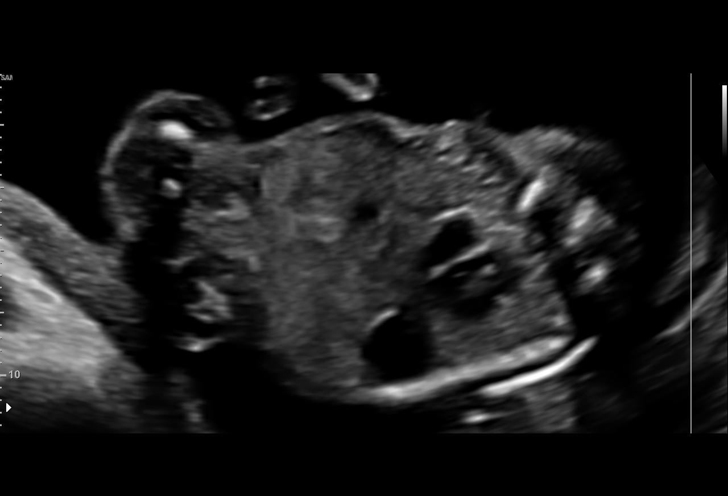
[im 58/75]
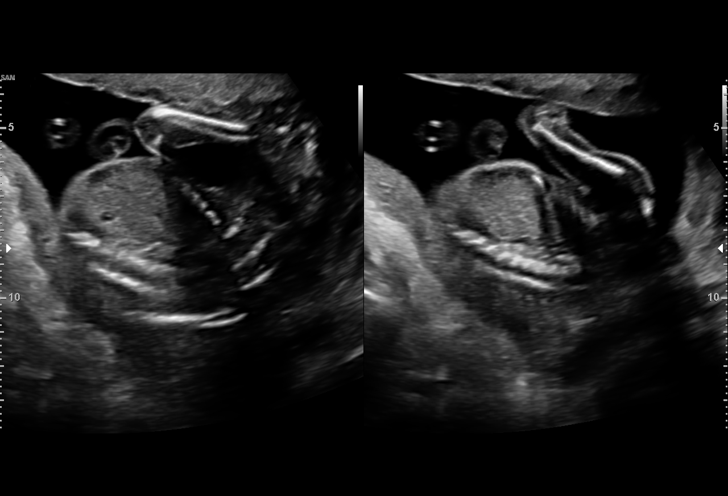
[im 64/75]
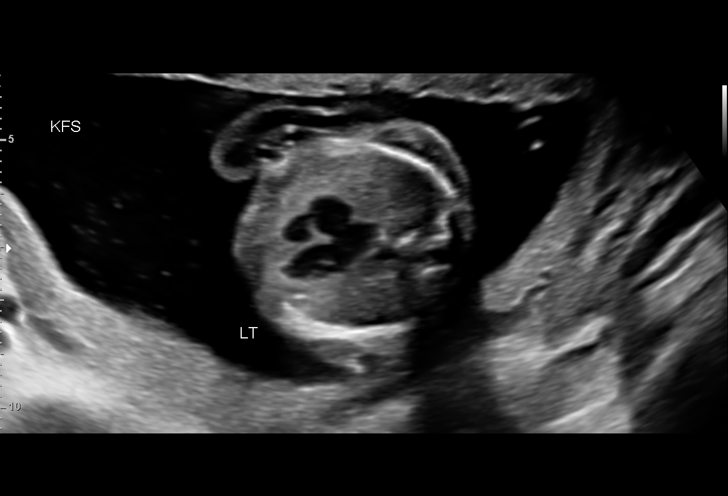
[im 69/75]
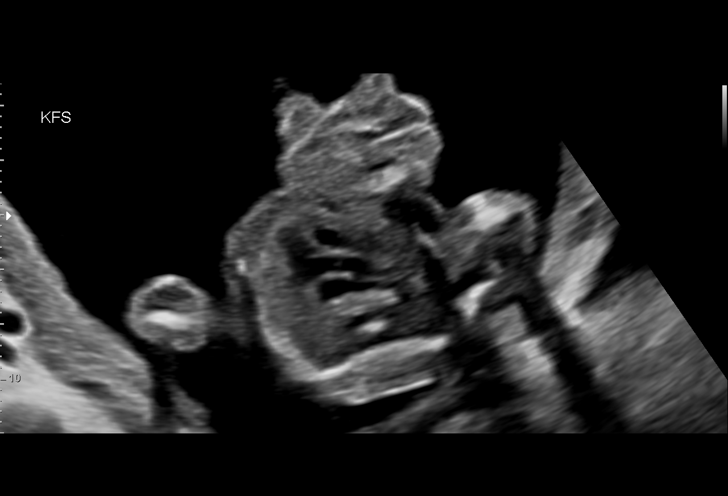
[im 75/75]
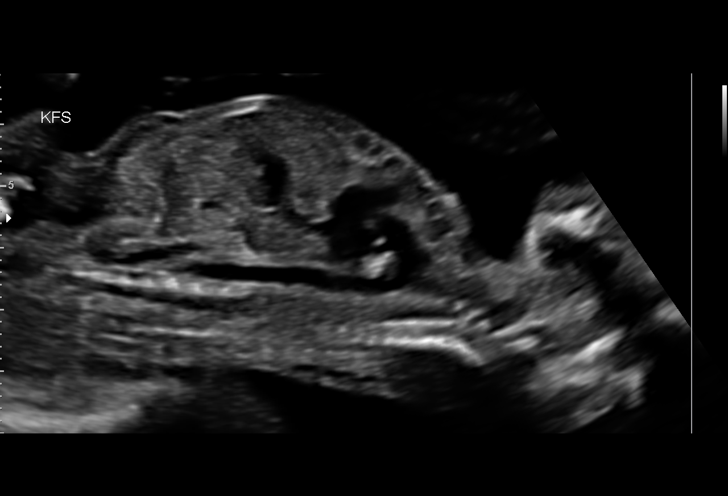

[14 of 28 positions shown; findings below may reference images not displayed]

BROWN

1  ALEKSANDER SURESH           [id number]      [id number]     [id number]
Indications

18 weeks gestation of pregnancy
Encounter for antenatal screening for
malformations
OB History

Gravidity:    1         Term:   0        Prem:   0         SAB:   0
TOP:          0       Ectopic:  0        Living: 0
Fetal Evaluation

Num Of Fetuses:     1
Fetal Heart         158
Rate(bpm):
Cardiac Activity:   Observed
Presentation:       Cephalic
Placenta:           Anterior, above cervical os
P. Cord Insertion:  Visualized

Amniotic Fluid
AFI FV:      Subjectively within normal limits
Biometry

BPD:      45.5  mm     G. Age:  19w 5d         85  %    CI:         75.22  %    70 - 86
FL/HC:       18.1  %    16.1 -
HC:      166.4  mm     G. Age:  19w 2d         65  %    HC/AC:       1.05       1.09 -
AC:      157.9  mm     G. Age:  20w 6d         95  %    FL/BPD:      66.4  %
FL:       30.2  mm     G. Age:  19w 3d         60  %    FL/AC:       19.1  %    20 - 24
HUM:      30.5  mm     G. Age:  20w 1d         83  %
CER:      20.5  mm     G. Age:  19w 4d         66  %
Est. FW:     332   gm    0 lb 12 oz     64  %
Gestational Age

LMP:           18w 6d        Date:  01/28/16                 EDD:    11/03/16
U/S Today:     19w 6d                                        EDD:    10/27/16
Best:          18w 6d     Det. By:  LMP  (01/28/16)          EDD:    11/03/16
Anatomy

Cranium:               Appears normal         Aortic Arch:            Appears normal
Cavum:                 Appears normal         Ductal Arch:            Appears normal
Ventricles:            Appears normal         Diaphragm:              Appears normal
Choroid Plexus:        Appears normal         Stomach:                Appears normal, left
sided
Cerebellum:            Appears normal         Abdomen:                Appears normal
Posterior Fossa:       Appears normal         Abdominal Wall:         Appears nml (cord
insert, abd wall)
Nuchal Fold:           Appears normal         Cord Vessels:           Appears normal (3
vessel cord)
Face:                  Appears normal         Kidneys:                Appear normal
(orbits and profile)
Lips:                  Appears normal         Bladder:                Appears normal
Thoracic:              Appears normal         Spine:                  Not well visualized
Heart:                 Appears normal         Upper Extremities:      Appears normal
(4CH, axis, and situs
RVOT:                  Appears normal         Lower Extremities:      Appears normal
LVOT:                  Appears normal

Other:  Fetus appears to be a male. Heels visualized. Nasal bone visualized.
Cervix Uterus Adnexa

Cervix
Length:            3.4  cm.
Normal appearance by transabdominal scan.
Impression

SIUP at 18+6 weeks
Normal detailed fetal anatomy; limited views of the spine
Markers of aneuploidy: none
Normal amniotic fluid volume
Measurements consistent with LMP dating
Recommendations

Follow-up as clinically indicated or follow-up ultrasound in 4-6
weeks to complete anatomy survey
